# Patient Record
Sex: Female | Born: 1985
Health system: Southern US, Community
[De-identification: ages and names within clinical notes are randomized; demographics above are authoritative.]

## PROBLEM LIST (undated history)

## (undated) DIAGNOSIS — L732 Hidradenitis suppurativa: Secondary | ICD-10-CM

## (undated) HISTORY — DX: Hidradenitis suppurativa: L73.2

## (undated) HISTORY — PX: NO PAST SURGERIES: SHX2092

---

## 2006-09-04 ENCOUNTER — Ambulatory Visit (HOSPITAL_COMMUNITY): Admission: RE | Admit: 2006-09-04 | Discharge: 2006-09-04 | Payer: Self-pay | Admitting: Obstetrics & Gynecology

## 2006-10-09 ENCOUNTER — Ambulatory Visit (HOSPITAL_COMMUNITY): Admission: RE | Admit: 2006-10-09 | Discharge: 2006-10-09 | Payer: Self-pay | Admitting: Obstetrics & Gynecology

## 2007-01-11 ENCOUNTER — Inpatient Hospital Stay (HOSPITAL_COMMUNITY): Admission: AD | Admit: 2007-01-11 | Discharge: 2007-01-11 | Payer: Self-pay | Admitting: Obstetrics & Gynecology

## 2007-01-12 ENCOUNTER — Inpatient Hospital Stay (HOSPITAL_COMMUNITY): Admission: AD | Admit: 2007-01-12 | Discharge: 2007-01-12 | Payer: Self-pay | Admitting: Obstetrics & Gynecology

## 2007-01-12 ENCOUNTER — Inpatient Hospital Stay (HOSPITAL_COMMUNITY): Admission: AD | Admit: 2007-01-12 | Discharge: 2007-01-15 | Payer: Self-pay | Admitting: Obstetrics

## 2008-05-23 ENCOUNTER — Ambulatory Visit (HOSPITAL_COMMUNITY): Admission: RE | Admit: 2008-05-23 | Discharge: 2008-05-23 | Payer: Self-pay | Admitting: Obstetrics & Gynecology

## 2008-10-01 ENCOUNTER — Inpatient Hospital Stay (HOSPITAL_COMMUNITY): Admission: AD | Admit: 2008-10-01 | Discharge: 2008-10-03 | Payer: Self-pay | Admitting: Obstetrics & Gynecology

## 2008-10-01 ENCOUNTER — Inpatient Hospital Stay (HOSPITAL_COMMUNITY): Admission: AD | Admit: 2008-10-01 | Discharge: 2008-10-01 | Payer: Self-pay | Admitting: Obstetrics

## 2010-10-03 ENCOUNTER — Encounter: Payer: Self-pay | Admitting: Obstetrics & Gynecology

## 2010-12-27 LAB — CBC
HCT: 31.1 % — ABNORMAL LOW (ref 36.0–46.0)
HCT: 34.9 % — ABNORMAL LOW (ref 36.0–46.0)
Hemoglobin: 10.5 g/dL — ABNORMAL LOW (ref 12.0–15.0)
Hemoglobin: 11.8 g/dL — ABNORMAL LOW (ref 12.0–15.0)
MCHC: 33.7 g/dL (ref 30.0–36.0)
MCV: 90.7 fL (ref 78.0–100.0)
RBC: 3.39 MIL/uL — ABNORMAL LOW (ref 3.87–5.11)
RDW: 15.4 % (ref 11.5–15.5)
RDW: 15.9 % — ABNORMAL HIGH (ref 11.5–15.5)
WBC: 17 10*3/uL — ABNORMAL HIGH (ref 4.0–10.5)

## 2011-01-25 NOTE — H&P (Signed)
Annette Benitez, Annette Benitez      ACCOUNT NO.:  [id number]   MEDICAL RECORD NO.:  [id number]          PATIENT TYPE:  INP   LOCATION:  9127                          FACILITY:  [facility]   PHYSICIAN:  Roseanna Rainbow, M.D.DATE OF BIRTH:  01-20-1986   DATE OF ADMISSION:  10/01/2008  DATE OF DISCHARGE:                              HISTORY & PHYSICAL   CHIEF COMPLAINT:  The patient is a 25 year old para 1 with an estimated  date of confinement of January 18 with an intrauterine pregnancy at 40  plus weeks complaining of contractions.   HISTORY OF PRESENT ILLNESS:  Please see the above.  She denies rupture  of membranes.   ALLERGIES:  No known drug allergies.   MEDICATIONS:  Please see the medication reconciliation form.   OB RISK FACTORS:  GBS asymptomatic bacteriuria.   PRENATAL LABS:  Blood type is O positive.  Antibody screen negative.  Chlamydia probe negative.  Urine culture and sensitivity on May 08, 2008, greater than 100,000 colonies/mL.  GBS isolated.  Pap smear  negative.  A 1-hour GCT 86.  Hepatitis B surface antigen negative,  hematocrit 33, hemoglobin 10.8, HIV nonreactive, platelets 223,000.  RPR  nonreactive, rubella immune.  Sickle cell negative.  Varicella immune.   PAST OBSTETRICAL HISTORY:  There is a history of two voluntary  terminations of pregnancy.  In May 2008, she was delivered of a live  born female 8 pounds 5 ounces, full term vaginal delivery, no  complications.   PAST GYN HISTORY:  Noncontributory.   PAST MEDICAL HISTORY:  Recurrent urinary tract infections.   PAST SURGICAL HISTORY:  No previous surgery.   SOCIAL HISTORY:  She is a Diplomatic Services operational officer, single, living with the significant  other.  Does not give any significant history of alcohol usage, has no  significant smoking history.  Denies illicit drug use.   FAMILY HISTORY:  Remarkable for diabetes and hypertension.   PHYSICAL EXAMINATION:  VITAL SIGNS:  Stable, afebrile.  Fetal heart  tracing  reassuring.  Tocodynamometer uterine contractions every 2-4  minutes.  Sterile vaginal exam per the RN.  The cervix is 4 cm dilated.   ASSESSMENT:  Primipara at term, late latent versus early active labor.  Fetal heart tracing consistent with fetal well being.  GBS positive.   PLAN:  Admission, penicillin for GBS prophylaxis per protocol.  Monitor  progress.  Anticipate a spontaneous vaginal delivery.      Roseanna Rainbow, M.D.  Electronically Signed     LAJ/MEDQ  D:  10/01/2008  T:  10/02/2008  Job:  [id number]

## 2012-07-31 ENCOUNTER — Emergency Department (INDEPENDENT_AMBULATORY_CARE_PROVIDER_SITE_OTHER)
Admission: EM | Admit: 2012-07-31 | Discharge: 2012-07-31 | Disposition: A | Discharge status: Home / Self Care | Payer: Self-pay | Source: Home / Self Care | Attending: Emergency Medicine | Admitting: Emergency Medicine

## 2012-07-31 ENCOUNTER — Encounter (HOSPITAL_COMMUNITY): Payer: Self-pay | Admitting: *Deleted

## 2012-07-31 DIAGNOSIS — M549 Dorsalgia, unspecified: Secondary | ICD-10-CM

## 2012-07-31 DIAGNOSIS — M542 Cervicalgia: Secondary | ICD-10-CM

## 2012-07-31 MED ORDER — KETOROLAC TROMETHAMINE 60 MG/2ML IM SOLN
INTRAMUSCULAR | Status: AC
  Filled 2012-07-31: qty 2

## 2012-07-31 MED ORDER — CYCLOBENZAPRINE HCL 10 MG PO TABS: 10.0000 mg | ORAL_TABLET | Freq: Three times a day (TID) | ORAL | Status: DC | PRN

## 2012-07-31 MED ORDER — NAPROXEN 500 MG PO TABS: 500.0000 mg | ORAL_TABLET | Freq: Two times a day (BID) | ORAL | Status: DC

## 2012-07-31 MED ORDER — TRAMADOL HCL 50 MG PO TABS: 50.0000 mg | ORAL_TABLET | Freq: Four times a day (QID) | ORAL | Status: DC | PRN

## 2012-07-31 MED ORDER — KETOROLAC TROMETHAMINE 60 MG/2ML IM SOLN
60.0000 mg | Freq: Once | INTRAMUSCULAR | Status: AC
  Administered 2012-07-31: 60 mg via INTRAMUSCULAR

## 2012-07-31 NOTE — ED Provider Notes (Signed)
Medical screening examination/treatment/procedure(s) were performed by non-physician practitioner and as supervising physician I was immediately available for consultation/collaboration.  Leslee Home, M.D.   Reuben Likes, MD 07/31/12 [phone]
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
## 2013-10-01 ENCOUNTER — Encounter: Payer: Self-pay | Admitting: Obstetrics & Gynecology

## 2013-11-18 ENCOUNTER — Ambulatory Visit: Payer: Self-pay | Admitting: Obstetrics & Gynecology

## 2014-08-17 ENCOUNTER — Encounter (HOSPITAL_COMMUNITY): Payer: Self-pay | Admitting: *Deleted

## 2014-08-17 ENCOUNTER — Emergency Department (HOSPITAL_COMMUNITY)
Admission: EM | Admit: 2014-08-17 | Discharge: 2014-08-17 | Disposition: A | Discharge status: Home / Self Care | Payer: Federal, State, Local not specified - PPO | Attending: Emergency Medicine | Admitting: Emergency Medicine

## 2014-08-17 DIAGNOSIS — Z3202 Encounter for pregnancy test, result negative: Secondary | ICD-10-CM | POA: Diagnosis not present

## 2014-08-17 DIAGNOSIS — Z791 Long term (current) use of non-steroidal anti-inflammatories (NSAID): Secondary | ICD-10-CM | POA: Diagnosis not present

## 2014-08-17 DIAGNOSIS — Z87891 Personal history of nicotine dependence: Secondary | ICD-10-CM | POA: Diagnosis not present

## 2014-08-17 DIAGNOSIS — M545 Low back pain: Secondary | ICD-10-CM | POA: Diagnosis present

## 2014-08-17 LAB — URINALYSIS, ROUTINE W REFLEX MICROSCOPIC
Bilirubin Urine: NEGATIVE
GLUCOSE, UA: NEGATIVE mg/dL
Hgb urine dipstick: NEGATIVE
KETONES UR: 15 mg/dL — AB
Nitrite: NEGATIVE
PH: 5.5 (ref 5.0–8.0)
Protein, ur: NEGATIVE mg/dL
Specific Gravity, Urine: 1.027 (ref 1.005–1.030)
Urobilinogen, UA: 0.2 mg/dL (ref 0.0–1.0)

## 2014-08-17 LAB — POC URINE PREG, ED: PREG TEST UR: NEGATIVE

## 2014-08-17 LAB — URINE MICROSCOPIC-ADD ON

## 2014-08-17 MED ORDER — IBUPROFEN 800 MG PO TABS: 800.0000 mg | ORAL_TABLET | Freq: Three times a day (TID) | ORAL | Status: DC | PRN

## 2014-08-17 MED ORDER — OXYCODONE-ACETAMINOPHEN 5-325 MG PO TABS
2.0000 | ORAL_TABLET | Freq: Once | ORAL | Status: AC
  Administered 2014-08-17: 2 via ORAL
  Filled 2014-08-17: qty 2

## 2014-08-17 MED ORDER — ONDANSETRON 4 MG PO TBDP
4.0000 mg | ORAL_TABLET | Freq: Once | ORAL | Status: AC
  Administered 2014-08-17: 4 mg via ORAL
  Filled 2014-08-17: qty 1

## 2014-08-17 MED ORDER — IBUPROFEN 800 MG PO TABS
800.0000 mg | ORAL_TABLET | Freq: Once | ORAL | Status: AC
  Administered 2014-08-17: 800 mg via ORAL
  Filled 2014-08-17: qty 1

## 2014-08-17 MED ORDER — DIAZEPAM 5 MG PO TABS: 5.0000 mg | ORAL_TABLET | Freq: Three times a day (TID) | ORAL | Status: DC | PRN

## 2014-08-17 MED ORDER — ONDANSETRON 4 MG PO TBDP: 4.0000 mg | ORAL_TABLET | Freq: Three times a day (TID) | ORAL | Status: DC | PRN

## 2014-08-17 MED ORDER — OXYCODONE-ACETAMINOPHEN 5-325 MG PO TABS: 1.0000 | ORAL_TABLET | Freq: Four times a day (QID) | ORAL | Status: DC | PRN

## 2014-08-17 NOTE — ED Provider Notes (Signed)
TIME SEEN: 12:15 PM  CHIEF COMPLAINT: Back pain  HPI: Pt is a 28 y.o. F with no significant past medical history who presents to the emergency department with complaints of lower back pain that started last night. He states pain started with pain in her buttocks and was worse with bending over. She states it now radiates into her lower abdomen is still worse with movement. Yesterday she did have some pressure with urinating and urinary frequency but to a Pyridium tablet and drink cranberry juice and states this resolved. No hematuria. No vaginal bleeding or discharge. Last menstrual period was November 17.  She denies fevers, chills, nausea, vomiting or diarrhea. She is sexually active with her husband only. She has an IUD.  ROS: See HPI Constitutional: no fever  Eyes: no drainage  ENT: no runny nose   Cardiovascular:  no chest pain  Resp: no SOB  GI: no vomiting GU:  dysuria Integumentary: no rash  Allergy: no hives  Musculoskeletal: no leg swelling  Neurological: no slurred speech ROS otherwise negative  PAST MEDICAL HISTORY/PAST SURGICAL HISTORY:  History reviewed. No pertinent past medical history.  MEDICATIONS:  Prior to Admission medications   Medication Sig Start Date End Date Taking? Authorizing Provider  ibuprofen (ADVIL,MOTRIN) 200 MG tablet Take 400 mg by mouth every 8 (eight) hours as needed for headache.   Yes Historical Provider, MD  traMADol (ULTRAM) 50 MG tablet Take 1 tablet (50 mg total) by mouth every 6 (six) hours as needed for pain. 07/31/12  Yes Johnsie Kindredarmen L Chatten, NP  cyclobenzaprine (FLEXERIL) 10 MG tablet Take 1 tablet (10 mg total) by mouth 3 (three) times daily as needed for muscle spasms. 07/31/12   Johnsie Kindredarmen L Chatten, NP  naproxen (NAPROSYN) 500 MG tablet Take 1 tablet (500 mg total) by mouth 2 (two) times daily with a meal. Patient not taking: Reported on 08/17/2014 07/31/12   Johnsie Kindredarmen L Chatten, NP    ALLERGIES:  No Known Allergies  SOCIAL HISTORY:   History  Substance Use Topics  . Smoking status: Former Games developermoker  . Smokeless tobacco: Not on file  . Alcohol Use: Yes     Comment: occasionally    FAMILY HISTORY: History reviewed. No pertinent family history.  EXAM: BP 115/83 mmHg  Pulse 95  Temp(Src) 98.1 F (36.7 C) (Oral)  Resp 17  Ht 5\' 7"  (1.702 m)  Wt 160 lb (72.576 kg)  BMI 25.05 kg/m2  SpO2 100%  LMP 07/29/2014 CONSTITUTIONAL: Alert and oriented and responds appropriately to questions. Well-appearing; well-nourished HEAD: Normocephalic EYES: Conjunctivae clear, PERRL ENT: normal nose; no rhinorrhea; moist mucous membranes; pharynx without lesions noted NECK: Supple, no meningismus, no LAD  CARD: RRR; S1 and S2 appreciated; no murmurs, no clicks, no rubs, no gallops RESP: Normal chest excursion without splinting or tachypnea; breath sounds clear and equal bilaterally; no wheezes, no rhonchi, no rales,  ABD/GI: Normal bowel sounds; non-distended; soft, non-tender, no rebound, no guarding; no peritoneal signs, no tenderness at McBurney's point BACK:  The back appears normal and is tender to palpation over the lumbar paraspinal musculature without midline spinal tenderness or step-off or deformity, there is no CVA tenderness EXT: Normal ROM in all joints; non-tender to palpation; no edema; normal capillary refill; no cyanosis    SKIN: Normal color for age and race; warm NEURO: Moves all extremities equally; sensation to light touch intact diffusely, normal gait, no clonus, 2+ bilateral patellar and Achilles deep tendon reflexes PSYCH: The patient's mood and manner are appropriate.  Grooming and personal hygiene are appropriate.  MEDICAL DECISION MAKING: Patient here with what appears to be musculoskeletal back pain. She is neurologically intact and has no fever. No red flag symptoms to suggest epidural abscess, epidural hematoma, spinal stenosis, cauda equina, discitis or transverse myelitis. I do not feel she needs back  imaging at this time. She is not having vaginal bleeding or discharge and has no abdominal pain on exam. Given she did have dysuria and urinary frequency yesterday will obtain urinalysis. No flank tenderness on exam. Urine pregnancy test is negative. Will give ibuprofen, Percocet and reassess.  ED PROGRESS: Patient's urine shows leukocytes but also many squamous cells and few bacteria. Suspect dirty catch. No other sign of infection. I suspect this is more likely musculoskeletal in nature and I feel she is safe to be discharged home. We'll discharge with ibuprofen, Percocet, Valium and Zofran. Have advised her to follow-up with her PCP if pain continues despite medical management after one week. Discussed return precautions including abdominal pain, fevers, vomiting, neurologic deficits. She verbalized understanding and is comfortable with this plan.     Layla MawKristen N Sherron Mapp, DO 08/17/14 1346

## 2014-08-17 NOTE — ED Notes (Signed)
Pt was given water. 

## 2014-08-17 NOTE — ED Notes (Signed)
Pt reports lower abd pain and lower back pain, yesterday had urinary frequency and pressure, but pt took azo and felt better. Today has lower abd pain when she bends over.

## 2014-08-17 NOTE — Discharge Instructions (Signed)
Back Pain, Adult °Low back pain is very common. About 1 in 5 people have back pain. The cause of low back pain is rarely dangerous. The pain often gets better over time. About half of people with a sudden onset of back pain feel better in just 2 weeks. About 8 in 10 people feel better by 6 weeks.  °CAUSES °Some common causes of back pain include: °· Strain of the muscles or ligaments supporting the spine. °· Wear and tear (degeneration) of the spinal discs. °· Arthritis. °· Direct injury to the back. °DIAGNOSIS °Most of the time, the direct cause of low back pain is not known. However, back pain can be treated effectively even when the exact cause of the pain is unknown. Answering your caregiver's questions about your overall health and symptoms is one of the most accurate ways to make sure the cause of your pain is not dangerous. If your caregiver needs more information, he or she may order lab work or imaging tests (X-rays or MRIs). However, even if imaging tests show changes in your back, this usually does not require surgery. °HOME CARE INSTRUCTIONS °For many people, back pain returns. Since low back pain is rarely dangerous, it is often a condition that people can learn to manage on their own.  °· Remain active. It is stressful on the back to sit or stand in one place. Do not sit, drive, or stand in one place for more than 30 minutes at a time. Take short walks on level surfaces as soon as pain allows. Try to increase the length of time you walk each day. °· Do not stay in bed. Resting more than 1 or 2 days can delay your recovery. °· Do not avoid exercise or work. Your body is made to move. It is not dangerous to be active, even though your back may hurt. Your back will likely heal faster if you return to being active before your pain is gone. °· Pay attention to your body when you  bend and lift. Many people have less discomfort when lifting if they bend their knees, keep the load close to their bodies, and  avoid twisting. Often, the most comfortable positions are those that put less stress on your recovering back. °· Find a comfortable position to sleep. Use a firm mattress and lie on your side with your knees slightly bent. If you lie on your back, put a pillow under your knees. °· Only take over-the-counter or prescription medicines as directed by your caregiver. Over-the-counter medicines to reduce pain and inflammation are often the most helpful. Your caregiver may prescribe muscle relaxant drugs. These medicines help dull your pain so you can more quickly return to your normal activities and healthy exercise. °· Put ice on the injured area. °¨ Put ice in a plastic bag. °¨ Place a towel between your skin and the bag. °¨ Leave the ice on for 15-20 minutes, 03-04 times a day for the first 2 to 3 days. After that, ice and heat may be alternated to reduce pain and spasms. °· Ask your caregiver about trying back exercises and gentle massage. This may be of some benefit. °· Avoid feeling anxious or stressed. Stress increases muscle tension and can worsen back pain. It is important to recognize when you are anxious or stressed and learn ways to manage it. Exercise is a great option. °SEEK MEDICAL CARE IF: °· You have pain that is not relieved with rest or medicine. °· You have pain that does not improve in 1 week. °· You have new symptoms. °· You are generally not feeling well. °SEEK   IMMEDIATE MEDICAL CARE IF:  °· You have pain that radiates from your back into your legs. °· You develop new bowel or bladder control problems. °· You have unusual weakness or numbness in your arms or legs. °· You develop nausea or vomiting. °· You develop abdominal pain. °· You feel faint. °Document Released: 08/29/2005 Document Revised: 02/28/2012 Document Reviewed: 12/31/2013 °ExitCare® Patient Information ©2015 [employer]. This information is not intended to replace advice given to you by your health care provider. Make sure you  discuss any questions you have with your health care provider. ° °Lumbosacral Strain °Lumbosacral strain is a strain of any of the parts that make up your lumbosacral vertebrae. Your lumbosacral vertebrae are the bones that make up the lower third of your backbone. Your lumbosacral vertebrae are held together by muscles and tough, fibrous tissue (ligaments).  °CAUSES  °A sudden blow to your back can cause lumbosacral strain. Also, anything that causes an excessive stretch of the muscles in the low back can cause this strain. This is typically seen when people exert themselves strenuously, fall, lift heavy objects, bend, or crouch repeatedly. °RISK FACTORS °· Physically demanding work. °· Participation in pushing or pulling sports or sports that require a sudden twist of the back (tennis, golf, baseball). °· Weight lifting. °· Excessive lower back curvature. °· Forward-tilted pelvis. °· Weak back or abdominal muscles or both. °· Tight hamstrings. °SIGNS AND SYMPTOMS  °Lumbosacral strain may cause pain in the area of your injury or pain that moves (radiates) down your leg.  °DIAGNOSIS °Your health care provider can often diagnose lumbosacral strain through a physical exam. In some cases, you may need tests such as X-ray exams.  °TREATMENT  °Treatment for your lower back injury depends on many factors that your clinician will have to evaluate. However, most treatment will include the use of anti-inflammatory medicines. °HOME CARE INSTRUCTIONS  °· Avoid hard physical activities (tennis, racquetball, waterskiing) if you are not in proper physical condition for it. This may aggravate or create problems. °· If you have a back problem, avoid sports requiring sudden body movements. Swimming and walking are generally safer activities. °· Maintain good posture. °· Maintain a healthy weight. °· For acute conditions, you may put ice on the injured area. °¨ Put ice in a plastic bag. °¨ Place a towel between your skin and the  bag. °¨ Leave the ice on for 20 minutes, 2-3 times a day. °· When the low back starts healing, stretching and strengthening exercises may be recommended. °SEEK MEDICAL CARE IF: °· Your back pain is getting worse. °· You experience severe back pain not relieved with medicines. °SEEK IMMEDIATE MEDICAL CARE IF:  °· You have numbness, tingling, weakness, or problems with the use of your arms or legs. °· There is a change in bowel or bladder control. °· You have increasing pain in any area of the body, including your belly (abdomen). °· You notice shortness of breath, dizziness, or feel faint. °· You feel sick to your stomach (nauseous), are throwing up (vomiting), or become sweaty. °· You notice discoloration of your toes or legs, or your feet get very cold. °MAKE SURE YOU:  °· Understand these instructions. °· Will watch your condition. °· Will get help right away if you are not doing well or get worse. °Document Released: 06/08/2005 Document Revised: 09/03/2013 Document Reviewed: 04/17/2013 °ExitCare® Patient Information ©2015 [employer]. This information is not intended to replace advice given to you by your health care provider.   Make sure you discuss any questions you have with your health care provider. ° °

## 2014-09-08 ENCOUNTER — Encounter: Payer: Self-pay | Admitting: *Deleted

## 2014-09-09 ENCOUNTER — Encounter: Payer: Self-pay | Admitting: Obstetrics & Gynecology

## 2016-09-12 NOTE — L&D Delivery Note (Signed)
Patient is 31 y.o. [id number] [redacted]w[redacted]d admitted for SOL. S/p augmentation with Pitocin. AROM at 1520.  Prenatal course also complicated by hydradenitis suppurativa and anemia.  Delivery Note At 6:51 PM a viable female was delivered via Vaginal, Spontaneous Delivery (Presentation: cephalic; ROA).  APGAR: 8, 9; weight pending.   Placenta status: grossly normal, intact.  Cord: 3 vessel without complications.  Cord pH: not drawn  Anesthesia: Epidural Episiotomy: None Lacerations: None Suture Repair: none Est. Blood Loss (mL): 100   Upon arrival patient was complete and pushing. She pushed with good maternal effort to deliver a viable female infant in cephalic, ROA position. No nuchal cord present.  Baby delivered without difficulty (anterior shoulder delivered with gentle traction on bilateral axillae), was noted to have good tone and placed on maternal abdomen for oral suctioning, drying and stimulation. Delayed cord clamping performed. Placenta delivered spontaneously with gentle cord traction. Fundus firm with massage and Pitocin. Perineum inspected and found to have no lacerations.  Counts of sharps, instruments, and lap pads were all correct.  Mom to postpartum.  Baby to Couplet care / Skin to Skin.  Amanda C. Frances Furbish, MD PGY-1, [facility] 06/06/2017 7:07 PM  Patient is a [id number] at [redacted]w[redacted]d who was admitted in latent labor and augmented with Pit/AROM, essentially uncomplicated prenatal course.  She progressed with augmentation via Pit/AROM.  I was gloved and present for delivery in its entirety.  Second stage of labor progressed, baby delivered after a few contractions.  No decels during second stage noted.  Complications: none  Lacerations: none  EBL: 100cc  Garyson Stelly, CNM 8:14 PM 06/06/2017

## 2016-09-30 ENCOUNTER — Ambulatory Visit (INDEPENDENT_AMBULATORY_CARE_PROVIDER_SITE_OTHER): Payer: Federal, State, Local not specified - PPO

## 2016-09-30 DIAGNOSIS — Z3201 Encounter for pregnancy test, result positive: Secondary | ICD-10-CM | POA: Diagnosis not present

## 2016-09-30 DIAGNOSIS — Z3200 Encounter for pregnancy test, result unknown: Secondary | ICD-10-CM

## 2016-09-30 LAB — POCT URINE PREGNANCY: PREG TEST UR: POSITIVE — AB

## 2016-09-30 NOTE — Progress Notes (Signed)
Patient is in the office for pregnancy test, results are positive. LMP 08-22-16, EDD 05-29-17, patient is 3546w4d.

## 2016-11-11 ENCOUNTER — Encounter: Payer: Self-pay | Admitting: Certified Nurse Midwife

## 2016-11-11 ENCOUNTER — Ambulatory Visit (INDEPENDENT_AMBULATORY_CARE_PROVIDER_SITE_OTHER): Payer: Federal, State, Local not specified - PPO | Admitting: Certified Nurse Midwife

## 2016-11-11 VITALS — BP 131/73 | HR 114 | Wt 188.6 lb

## 2016-11-11 DIAGNOSIS — Z113 Encounter for screening for infections with a predominantly sexual mode of transmission: Secondary | ICD-10-CM

## 2016-11-11 DIAGNOSIS — Z124 Encounter for screening for malignant neoplasm of cervix: Secondary | ICD-10-CM

## 2016-11-11 DIAGNOSIS — Z1151 Encounter for screening for human papillomavirus (HPV): Secondary | ICD-10-CM | POA: Diagnosis not present

## 2016-11-11 DIAGNOSIS — Z3481 Encounter for supervision of other normal pregnancy, first trimester: Secondary | ICD-10-CM

## 2016-11-11 DIAGNOSIS — Z3687 Encounter for antenatal screening for uncertain dates: Secondary | ICD-10-CM

## 2016-11-11 DIAGNOSIS — L732 Hidradenitis suppurativa: Secondary | ICD-10-CM | POA: Insufficient documentation

## 2016-11-11 DIAGNOSIS — Z3480 Encounter for supervision of other normal pregnancy, unspecified trimester: Secondary | ICD-10-CM

## 2016-11-11 MED ORDER — AMOXICILLIN-POT CLAVULANATE 875-125 MG PO TABS: 1.0000 | ORAL_TABLET | Freq: Two times a day (BID) | ORAL | 0 refills | Status: DC

## 2016-11-11 NOTE — Progress Notes (Signed)
Pain in heels and body aching with pregnancy.

## 2016-11-11 NOTE — Addendum Note (Signed)
Addended by: Elby BeckPAUL, JANE F on: 11/11/2016 10:32 AM   Modules accepted: Orders

## 2016-11-11 NOTE — Progress Notes (Signed)
Subjective:    Annette Benitez is being seen today for her first obstetrical visit.  This is a planned pregnancy. She is at [redacted]w[redacted]d gestation. Her obstetrical history is significant for none. Relationship with FOB: spouse, living together. Patient does intend to breast feed. Pregnancy history fully reviewed.  The information documented in the HPI was reviewed and verified.  Menstrual History: OB History    Gravida Para Term Preterm AB Living   5 2 2   2 2    SAB TAB Ectopic Multiple Live Births     2     2       Patient's last menstrual period was 08/22/2016 (exact date).    Past Medical History:  Diagnosis Date  . Hidradenitis     History reviewed. No pertinent surgical history.   (Not in a hospital admission) No Known Allergies  Social History  Substance Use Topics  . Smoking status: Former Games developer  . Smokeless tobacco: Never Used  . Alcohol use No     Comment: occasionally    Family History  Problem Relation Age of Onset  . Hypertension Father      Review of Systems Constitutional: negative for weight loss Gastrointestinal: negative for vomiting Genitourinary:negative for genital lesions and vaginal discharge and dysuria Musculoskeletal:negative for back pain Behavioral/Psych: negative for abusive relationship, depression, illegal drug usage and tobacco use    Objective:    BP 131/73   Pulse (!) 114   Wt 188 lb 9.6 oz (85.5 kg)   LMP 08/22/2016 (Exact Date)   BMI 29.54 kg/m  General Appearance:    Alert, cooperative, no distress, appears stated age  Head:    Normocephalic, without obvious abnormality, atraumatic  Eyes:    PERRL, conjunctiva/corneas clear, EOM's intact, fundi    benign, both eyes  Ears:    Normal TM's and external ear canals, both ears  Nose:   Nares normal, septum midline, mucosa normal, no drainage    or sinus tenderness  Throat:   Lips, mucosa, and tongue normal; teeth and gums normal  Neck:   Supple, symmetrical, trachea midline, no  adenopathy;    thyroid:  no enlargement/tenderness/nodules; no carotid   bruit or JVD  Back:     Symmetric, no curvature, ROM normal, no CVA tenderness  Lungs:     Clear to auscultation bilaterally, respirations unlabored  Chest Wall:    No tenderness or deformity   Heart:    Regular rate and rhythm, S1 and S2 normal, no murmur, rub   or gallop  Breast Exam:    No tenderness, masses, or nipple abnormality  Abdomen:     Soft, non-tender, bowel sounds active all four quadrants,    no masses, no organomegaly  Genitalia:    Normal female, + multiple boils present on labia and groin. Declines lancing today.   Extremities:   Extremities normal, atraumatic, no cyanosis or edema  Pulses:   2+ and symmetric all extremities  Skin:   Skin color, texture, turgor normal, no rashes or lesions  Lymph nodes:   Cervical, supraclavicular, and axillary nodes normal  Neurologic:   CNII-XII intact, normal strength, sensation and reflexes    throughout          Cervix:  Long, closed, thick, posterior.  FHR: 172   By doppler.   Size around 12    wk size.     Lab Review Urine pregnancy test Labs reviewed no Radiologic studies reviewed no Assessment:    Pregnancy at [redacted]w[redacted]d  weeks   Supervision of other normal pregnancy, antepartum - Plan: TSH, Hemoglobinopathy evaluation, Varicella zoster antibody, IgG, Culture, OB Urine, MaterniT21 PLUS Core+SCA, Hemoglobin A1c, Obstetric Panel, Including HIV, Cystic Fibrosis Mutation 97  Unsure of LMP (last menstrual period) as reason for ultrasound scan - Plan: US OB Comp Less 14 Wks, US OB Transvaginal  Hidradenitis - Plan: amoxicillin-clavulanate (AUGMENTIN) 875-125 MG tablet   Plan:      Prenatal vitamins. Samples OB complete given.  Counseling provided regarding continued use of seat belts, cessation of alcohol consumption, smoking or use of illicit drugs; infection precautions i.e., influenza/TDAP immunizations, toxoplasmosis,CMV, parvovirus, listeria and  varicella; workplace safety, exercise during pregnancy; routine dental care, safe medications, sexual activity, hot tubs, saunas, pools, travel, caffeine use, fish and methlymercury, potential toxins, hair treatments, varicose veins Weight gain recommendations per IOM guidelines reviewed: underweight/BMI< 18.5--> gain 28 - 40 lbs; normal weight/BMI 18.5 - 24.9--> gain 25 - 35 lbs; overweight/BMI 25 - 29.9--> gain 15 - 25 lbs; obese/BMI >30->gain  11 - 20 lbs Problem list reviewed and updated. FIRST/CF mutation testing/NIPT/QUAD SCREEN/fragile X/Ashkenazi Jewish population testing/Spinal muscular atrophy discussed: ordered. Role of ultrasound in pregnancy discussed; fetal survey: requested. Amniocentesis discussed: not indicated.  Meds ordered this encounter  Medications  . amoxicillin-clavulanate (AUGMENTIN) 875-125 MG tablet    Sig: Take 1 tablet by mouth 2 (two) times daily.    Dispense:  28 tablet    Refill:  0   Orders Placed This Encounter  Procedures  . Culture, OB Urine  . US OB Comp Less 14 Wks    Standing Status:   Future    Standing Expiration Date:   01/11/2018    Order Specific Question:   Reason for Exam (SYMPTOM  OR DIAGNOSIS REQUIRED)    Answer:   dating    Order Specific Question:   Preferred imaging location?    Answer:   [facility]  . US OB Transvaginal    Standing Status:   Future    Standing Expiration Date:   01/11/2018    Order Specific Question:   Reason for Exam (SYMPTOM  OR DIAGNOSIS REQUIRED)    Answer:   dating    Order Specific Question:   Preferred imaging location?    Answer:   [facility]  . TSH  . Hemoglobinopathy evaluation  . Varicella zoster antibody, IgG  . MaterniT21 PLUS Core+SCA    Order Specific Question:   Is the patient insulin dependent?    Answer:   No    Order Specific Question:   Please enter gestational age. This should be expressed as weeks AND days, i.e. 16w 6d. Enter weeks here. Enter days in next question.    Answer:   9     Order Specific Question:   Please enter gestational age. This should be expressed as weeks AND days, i.e. 16w 6d. Enter days here. Enter weeks in previous question.    Answer:   4    Order Specific Question:   How was gestational age calculated?    Answer:   LMP    Order Specific Question:   Please give the date of LMP OR Ultrasound OR Estimated date of delivery.    Answer:   05/29/2017    Order Specific Question:   Number of Fetuses (Type of Pregnancy):    Answer:   1    Order Specific Question:   Indications for performing the test? (please choose all that apply):    Answer:  Routine screening    Order Specific Question:   Other Indications? (Y=Yes, N=No)    Answer:   N    Order Specific Question:   If this is a repeat specimen, please indicate the reason:    Answer:   Not indicated    Order Specific Question:   Please specify the patient's race: (C=White/Caucasion, B=Black, I=Native American, A=Asian, H=Hispanic, O=Other, U=Unknown)    Answer:   B    Order Specific Question:   Donor Egg - indicate if the egg was obtained from in vitro fertilization.    Answer:   N    Order Specific Question:   Age of Egg Donor.    Answer:   [age]    Order Specific Question:   Prior Down Syndrome/ONTD screening during current pregnancy.    Answer:   N    Order Specific Question:   Prior First Trimester Testing    Answer:   N    Order Specific Question:   Prior Second Trimester Testing    Answer:   N    Order Specific Question:   Family History of Neural Tube Defects    Answer:   N    Order Specific Question:   Prior Pregnancy with Down Syndrome    Answer:   N    Order Specific Question:   Please give the patient's weight (in pounds)    Answer:   182  . Hemoglobin A1c  . Obstetric Panel, Including HIV  . Cystic Fibrosis Mutation 97    Follow up in 4 weeks. 50% of 30 min visit spent on counseling and coordination of care.

## 2016-11-13 LAB — CULTURE, OB URINE

## 2016-11-13 LAB — URINE CULTURE, OB REFLEX: Organism ID, Bacteria: NO GROWTH

## 2016-11-14 LAB — CYTOLOGY - PAP
Diagnosis: NEGATIVE
HPV (WINDOPATH): NOT DETECTED

## 2016-11-14 LAB — CERVICOVAGINAL ANCILLARY ONLY
BACTERIAL VAGINITIS: NEGATIVE
Candida vaginitis: NEGATIVE
Chlamydia: NEGATIVE
Neisseria Gonorrhea: NEGATIVE
TRICH (WINDOWPATH): NEGATIVE

## 2016-11-15 LAB — OBSTETRIC PANEL, INCLUDING HIV
Antibody Screen: NEGATIVE
BASOS ABS: 0 10*3/uL (ref 0.0–0.2)
Basos: 0 %
EOS (ABSOLUTE): 0.1 10*3/uL (ref 0.0–0.4)
Eos: 1 %
HEP B S AG: NEGATIVE
HIV Screen 4th Generation wRfx: NONREACTIVE
Hematocrit: 33.4 % — ABNORMAL LOW (ref 34.0–46.6)
Hemoglobin: 10.9 g/dL — ABNORMAL LOW (ref 11.1–15.9)
IMMATURE GRANULOCYTES: 0 %
Immature Grans (Abs): 0 10*3/uL (ref 0.0–0.1)
LYMPHS: 26 %
Lymphocytes Absolute: 3.3 10*3/uL — ABNORMAL HIGH (ref 0.7–3.1)
MCH: 29.5 pg (ref 26.6–33.0)
MCHC: 32.6 g/dL (ref 31.5–35.7)
MCV: 90 fL (ref 79–97)
MONOCYTES: 6 %
Monocytes Absolute: 0.8 10*3/uL (ref 0.1–0.9)
NEUTROS PCT: 67 %
Neutrophils Absolute: 8.6 10*3/uL — ABNORMAL HIGH (ref 1.4–7.0)
PLATELETS: 331 10*3/uL (ref 150–379)
RBC: 3.7 x10E6/uL — ABNORMAL LOW (ref 3.77–5.28)
RDW: 13.7 % (ref 12.3–15.4)
RPR: NONREACTIVE
RUBELLA: 5.17 {index} (ref >0.99)
Rh Factor: POSITIVE
WBC: 13 10*3/uL — ABNORMAL HIGH (ref 3.4–10.8)

## 2016-11-15 LAB — HEMOGLOBINOPATHY EVALUATION
HGB C: 0 %
HGB S: 0 %
HGB VARIANT: 0 %
Hemoglobin A2 Quantitation: 2.5 % (ref 1.8–3.2)
Hemoglobin F Quantitation: 0 % (ref 0.0–2.0)
Hgb A: 97.5 % (ref 96.4–98.8)

## 2016-11-15 LAB — HEMOGLOBIN A1C
ESTIMATED AVERAGE GLUCOSE: 111 mg/dL
HEMOGLOBIN A1C: 5.5 % (ref 4.8–5.6)

## 2016-11-15 LAB — TSH: TSH: 0.036 u[IU]/mL — AB (ref 0.450–4.500)

## 2016-11-15 LAB — VARICELLA ZOSTER ANTIBODY, IGG: VARICELLA: 1246 {index} (ref >165)

## 2016-11-16 ENCOUNTER — Other Ambulatory Visit: Payer: Self-pay | Admitting: Certified Nurse Midwife

## 2016-11-16 ENCOUNTER — Ambulatory Visit (HOSPITAL_COMMUNITY)
Admission: RE | Admit: 2016-11-16 | Discharge: 2016-11-16 | Disposition: A | Discharge status: Home / Self Care | Payer: Federal, State, Local not specified - PPO | Source: Ambulatory Visit | Attending: Certified Nurse Midwife | Admitting: Certified Nurse Midwife

## 2016-11-16 DIAGNOSIS — Z3A12 12 weeks gestation of pregnancy: Secondary | ICD-10-CM | POA: Diagnosis not present

## 2016-11-16 DIAGNOSIS — Z3689 Encounter for other specified antenatal screening: Secondary | ICD-10-CM | POA: Insufficient documentation

## 2016-11-16 DIAGNOSIS — Z3687 Encounter for antenatal screening for uncertain dates: Secondary | ICD-10-CM

## 2016-11-18 ENCOUNTER — Other Ambulatory Visit: Payer: Self-pay | Admitting: Certified Nurse Midwife

## 2016-11-18 DIAGNOSIS — Z3480 Encounter for supervision of other normal pregnancy, unspecified trimester: Secondary | ICD-10-CM

## 2016-11-18 LAB — CYSTIC FIBROSIS MUTATION 97: Interpretation: NOT DETECTED

## 2016-11-20 LAB — MATERNIT21 PLUS CORE+SCA
CHROMOSOME 13: NEGATIVE
CHROMOSOME 21: NEGATIVE
Chromosome 18: NEGATIVE
Y Chromosome: DETECTED

## 2016-11-21 ENCOUNTER — Other Ambulatory Visit: Payer: Self-pay | Admitting: Certified Nurse Midwife

## 2016-11-21 DIAGNOSIS — Z3480 Encounter for supervision of other normal pregnancy, unspecified trimester: Secondary | ICD-10-CM

## 2016-11-21 LAB — SPECIMEN STATUS REPORT

## 2016-11-21 LAB — THYROID PANEL
Free Thyroxine Index: 2.2 (ref 1.2–4.9)
T3 Uptake Ratio: 21 % — ABNORMAL LOW (ref 24–39)
T4, Total: 10.6 ug/dL (ref 4.5–12.0)

## 2016-12-08 ENCOUNTER — Ambulatory Visit (INDEPENDENT_AMBULATORY_CARE_PROVIDER_SITE_OTHER): Payer: Federal, State, Local not specified - PPO | Admitting: Certified Nurse Midwife

## 2016-12-08 ENCOUNTER — Encounter: Payer: Self-pay | Admitting: Certified Nurse Midwife

## 2016-12-08 VITALS — BP 114/72 | HR 98 | Temp 98.2°F | Wt 189.4 lb

## 2016-12-08 DIAGNOSIS — O99012 Anemia complicating pregnancy, second trimester: Secondary | ICD-10-CM

## 2016-12-08 DIAGNOSIS — L732 Hidradenitis suppurativa: Secondary | ICD-10-CM

## 2016-12-08 DIAGNOSIS — Z3482 Encounter for supervision of other normal pregnancy, second trimester: Secondary | ICD-10-CM

## 2016-12-08 DIAGNOSIS — D649 Anemia, unspecified: Secondary | ICD-10-CM

## 2016-12-08 DIAGNOSIS — Z3480 Encounter for supervision of other normal pregnancy, unspecified trimester: Secondary | ICD-10-CM

## 2016-12-08 DIAGNOSIS — B3731 Acute candidiasis of vulva and vagina: Secondary | ICD-10-CM

## 2016-12-08 DIAGNOSIS — B373 Candidiasis of vulva and vagina: Secondary | ICD-10-CM

## 2016-12-08 MED ORDER — TERCONAZOLE 0.8 % VA CREA: 1.0000 | TOPICAL_CREAM | Freq: Every day | VAGINAL | 0 refills | Status: DC

## 2016-12-08 MED ORDER — FERRALET 90 90-1 MG PO TABS: 1.0000 | ORAL_TABLET | Freq: Every day | ORAL | 5 refills | Status: DC

## 2016-12-08 MED ORDER — FLUCONAZOLE 150 MG PO TABS: 150.0000 mg | ORAL_TABLET | Freq: Once | ORAL | 0 refills | Status: AC

## 2016-12-08 NOTE — Progress Notes (Signed)
   PRENATAL VISIT NOTE  Subjective:  Annette Benitez is a 31 y.o. [id number] at 8877w3d being seen today for ongoing prenatal care.  She is currently monitored for the following issues for this low-risk pregnancy and has Supervision of other normal pregnancy, antepartum and Hidradenitis on her problem list.  Patient reports vaginal irritation and being "cold"..  Contractions: Not present. Vag. Bleeding: None.   . Denies leaking of fluid.   The following portions of the patient's history were reviewed and updated as appropriate: allergies, current medications, past family history, past medical history, past social history, past surgical history and problem list. Problem list updated.  Objective:   Vitals:   12/08/16 0927  BP: 114/72  Pulse: 98  Temp: 98.2 F (36.8 C)  Weight: 189 lb 6.4 oz (85.9 kg)    Fetal Status:           General:  Alert, oriented and cooperative. Patient is in no acute distress.  Skin: Skin is warm and dry. No rash noted.   Cardiovascular: Normal heart rate noted  Respiratory: Normal respiratory effort, no problems with respiration noted  Abdomen: Soft, gravid, appropriate for gestational age. Pain/Pressure: Absent     Pelvic:  Cervical exam deferred        Extremities: Normal range of motion.  Edema: None  Mental Status: Normal mood and affect. Normal behavior. Normal judgment and thought content.   Assessment and Plan:  Pregnancy: [id number] at 8577w3d  1. Supervision of other normal pregnancy, antepartum - AFP, Serum, Open Spina Bifida - US MFM OB COMP + 14 WK; Future  2. Hidradenitis  Augmentin rx'd  previous visit    3. Anemia affecting pregnancy in second trimester Hemoglobin 10.9 last visit.  - Fe Cbn-Fe Gluc-FA-B12-C-DSS (FERRALET 90) 90-1 MG TABS; Take 1 tablet by mouth daily.  Dispense: 30 each; Refill: 5  4. Yeast vaginitis  - fluconazole (DIFLUCAN) 150 MG tablet; Take 1 tablet (150 mg total) by mouth once.  Dispense: 1 tablet; Refill: 0 -  terconazole (TERAZOL 3) 0.8 % vaginal cream; Place 1 applicator vaginally at bedtime.  Dispense: 20 g; Refill: 0  Preterm labor symptoms and general obstetric precautions including but not limited to vaginal bleeding, contractions, leaking of fluid and fetal movement were reviewed in detail with the patient. Please refer to After Visit Summary for other counseling recommendations.  No Follow-up on file.   Annette ParkinsonShanika K Eesha Schmaltz, Student-MidWife

## 2016-12-08 NOTE — Progress Notes (Signed)
Patient is in the office, denies pain/pressure. 

## 2016-12-16 ENCOUNTER — Other Ambulatory Visit: Payer: Self-pay | Admitting: Certified Nurse Midwife

## 2016-12-16 DIAGNOSIS — Z3480 Encounter for supervision of other normal pregnancy, unspecified trimester: Secondary | ICD-10-CM

## 2016-12-16 LAB — AFP, SERUM, OPEN SPINA BIFIDA
AFP MoM: 0.31
AFP Value: 13.7 ng/mL
GEST. AGE ON COLLECTION DATE: 18.6 wk
Maternal Age At EDD: 32.9 years
OSBR Risk 1 IN: 10000
TEST RESULTS AFP: NEGATIVE
WEIGHT: 189 [lb_av]

## 2016-12-29 ENCOUNTER — Other Ambulatory Visit: Payer: Self-pay | Admitting: Certified Nurse Midwife

## 2016-12-29 ENCOUNTER — Ambulatory Visit (HOSPITAL_COMMUNITY)
Admission: RE | Admit: 2016-12-29 | Discharge: 2016-12-29 | Disposition: A | Discharge status: Home / Self Care | Payer: Federal, State, Local not specified - PPO | Source: Ambulatory Visit | Attending: Certified Nurse Midwife | Admitting: Certified Nurse Midwife

## 2016-12-29 DIAGNOSIS — Z363 Encounter for antenatal screening for malformations: Secondary | ICD-10-CM | POA: Diagnosis present

## 2016-12-29 DIAGNOSIS — Z3A18 18 weeks gestation of pregnancy: Secondary | ICD-10-CM | POA: Diagnosis present

## 2016-12-29 DIAGNOSIS — Z3689 Encounter for other specified antenatal screening: Secondary | ICD-10-CM

## 2016-12-29 DIAGNOSIS — Z3492 Encounter for supervision of normal pregnancy, unspecified, second trimester: Secondary | ICD-10-CM | POA: Diagnosis present

## 2016-12-29 DIAGNOSIS — Z3480 Encounter for supervision of other normal pregnancy, unspecified trimester: Secondary | ICD-10-CM

## 2017-01-02 ENCOUNTER — Other Ambulatory Visit: Payer: Self-pay | Admitting: Certified Nurse Midwife

## 2017-01-02 DIAGNOSIS — Z3480 Encounter for supervision of other normal pregnancy, unspecified trimester: Secondary | ICD-10-CM

## 2017-01-05 ENCOUNTER — Ambulatory Visit (INDEPENDENT_AMBULATORY_CARE_PROVIDER_SITE_OTHER): Payer: Federal, State, Local not specified - PPO | Admitting: Certified Nurse Midwife

## 2017-01-05 ENCOUNTER — Encounter: Payer: Self-pay | Admitting: Certified Nurse Midwife

## 2017-01-05 DIAGNOSIS — Z3482 Encounter for supervision of other normal pregnancy, second trimester: Secondary | ICD-10-CM

## 2017-01-05 DIAGNOSIS — Z3480 Encounter for supervision of other normal pregnancy, unspecified trimester: Secondary | ICD-10-CM

## 2017-01-05 NOTE — Progress Notes (Signed)
   PRENATAL VISIT NOTE  Subjective:  Annette Benitez is a 31 y.o. [id number] at [redacted]w[redacted]d being seen today for ongoing prenatal care.  She is currently monitored for the following issues for this low-risk pregnancy and has Supervision of other normal pregnancy, antepartum and Hidradenitis on her problem list.  Patient reports no complaints.  Contractions: Not present. Vag. Bleeding: None.  Movement: Absent. Denies leaking of fluid.   The following portions of the patient's history were reviewed and updated as appropriate: allergies, current medications, past family history, past medical history, past social history, past surgical history and problem list. Problem list updated.  Objective:   Vitals:   01/05/17 1002  BP: 107/68  Pulse: 92  Weight: 192 lb (87.1 kg)    Fetal Status: Fetal Heart Rate (bpm): 149 Fundal Height: 20 cm Movement: Absent     General:  Alert, oriented and cooperative. Patient is in no acute distress.  Skin: Skin is warm and dry. No rash noted.   Cardiovascular: Normal heart rate noted  Respiratory: Normal respiratory effort, no problems with respiration noted  Abdomen: Soft, gravid, appropriate for gestational age. Pain/Pressure: Absent     Pelvic:  Cervical exam deferred        Extremities: Normal range of motion.  Edema: Trace  Mental Status: Normal mood and affect. Normal behavior. Normal judgment and thought content.   Assessment and Plan:  Pregnancy: A5W0981 at [redacted]w[redacted]d  1. Supervision of other normal pregnancy, antepartum      Doing well. HS stable with Humira.   Preterm labor symptoms and general obstetric precautions including but not limited to vaginal bleeding, contractions, leaking of fluid and fetal movement were reviewed in detail with the patient. Please refer to After Visit Summary for other counseling recommendations.  Return in about 4 weeks (around 02/02/2017) for ROB.   Roe Coombs, CNM

## 2017-02-02 ENCOUNTER — Ambulatory Visit (INDEPENDENT_AMBULATORY_CARE_PROVIDER_SITE_OTHER): Payer: Federal, State, Local not specified - PPO | Admitting: Certified Nurse Midwife

## 2017-02-02 VITALS — BP 114/74 | HR 94 | Wt 192.0 lb

## 2017-02-02 DIAGNOSIS — Z3480 Encounter for supervision of other normal pregnancy, unspecified trimester: Secondary | ICD-10-CM

## 2017-02-02 DIAGNOSIS — L732 Hidradenitis suppurativa: Secondary | ICD-10-CM

## 2017-02-02 DIAGNOSIS — Z3482 Encounter for supervision of other normal pregnancy, second trimester: Secondary | ICD-10-CM

## 2017-02-02 NOTE — Progress Notes (Signed)
   PRENATAL VISIT NOTE  Subjective:  Annette Benitez is a 31 y.o. [phone] at 9463w3d being seen today for ongoing prenatal care.  She is currently monitored for the following issues for this low-risk pregnancy and has Supervision of other normal pregnancy, antepartum and Hidradenitis on her problem list.  Patient reports no complaints.  Contractions: Not present. Vag. Bleeding: None.  Movement: Present. Denies leaking of fluid.   The following portions of the patient's history were reviewed and updated as appropriate: allergies, current medications, past family history, past medical history, past social history, past surgical history and problem list. Problem list updated.  Objective:   Vitals:   02/02/17 0947  BP: 114/74  Pulse: 94  Weight: 192 lb (87.1 kg)    Fetal Status: Fetal Heart Rate (bpm): 150 Fundal Height: 23 cm Movement: Present     General:  Alert, oriented and cooperative. Patient is in no acute distress.  Skin: Skin is warm and dry. No rash noted.   Cardiovascular: Normal heart rate noted  Respiratory: Normal respiratory effort, no problems with respiration noted  Abdomen: Soft, gravid, appropriate for gestational age. Pain/Pressure: Absent     Pelvic:  Cervical exam deferred        Extremities: Normal range of motion.     Mental Status: Normal mood and affect. Normal behavior. Normal judgment and thought content.   Assessment and Plan:  Pregnancy: [id number] at 2663w3d  1. Supervision of other normal pregnancy, antepartum   Reports doing well, reports not taking Humira as often for HS,stable.  Preterm labor symptoms and general obstetric precautions including but not limited to vaginal bleeding, contractions, leaking of fluid and fetal movement were reviewed in detail with the patient. Please refer to After Visit Summary for other counseling recommendations.  Return in about 4 weeks (around 03/02/2017) for ROB, 2 hr OGTT.   Roe Coombsachelle A Denney, CNM

## 2017-02-27 ENCOUNTER — Encounter: Payer: Self-pay | Admitting: Certified Nurse Midwife

## 2017-02-27 ENCOUNTER — Encounter: Payer: Self-pay | Admitting: *Deleted

## 2017-02-27 ENCOUNTER — Other Ambulatory Visit: Payer: Self-pay | Admitting: Certified Nurse Midwife

## 2017-02-27 DIAGNOSIS — L732 Hidradenitis suppurativa: Secondary | ICD-10-CM

## 2017-02-27 MED ORDER — CLINDAMYCIN PHOSPHATE 1 % EX GEL: Freq: Two times a day (BID) | CUTANEOUS | 2 refills | Status: DC

## 2017-02-27 NOTE — Telephone Encounter (Signed)
Pt has been made aware that provider has sent in a Rx. Pt advised to call office if she feels this Rx does not help her symptoms.

## 2017-03-02 ENCOUNTER — Ambulatory Visit (INDEPENDENT_AMBULATORY_CARE_PROVIDER_SITE_OTHER): Payer: Federal, State, Local not specified - PPO | Admitting: Certified Nurse Midwife

## 2017-03-02 ENCOUNTER — Other Ambulatory Visit: Payer: Federal, State, Local not specified - PPO

## 2017-03-02 ENCOUNTER — Encounter: Payer: Self-pay | Admitting: Certified Nurse Midwife

## 2017-03-02 VITALS — BP 123/77 | HR 98 | Wt 194.4 lb

## 2017-03-02 DIAGNOSIS — Z3482 Encounter for supervision of other normal pregnancy, second trimester: Secondary | ICD-10-CM

## 2017-03-02 DIAGNOSIS — Z3480 Encounter for supervision of other normal pregnancy, unspecified trimester: Secondary | ICD-10-CM

## 2017-03-02 NOTE — Progress Notes (Signed)
   PRENATAL VISIT NOTE  Subjective:  Annette Benitez is a 31 y.o. [phone] at 4768w3d being seen today for ongoing prenatal care.  She is currently monitored for the following issues for this low-risk pregnancy and has Supervision of other normal pregnancy, antepartum and Hidradenitis on her problem list.  Patient reports no complaints.  Contractions: Not present. Vag. Bleeding: None.  Movement: Present. Denies leaking of fluid.   The following portions of the patient's history were reviewed and updated as appropriate: allergies, current medications, past family history, past medical history, past social history, past surgical history and problem list. Problem list updated.  Objective:   Vitals:   03/02/17 0907  BP: 123/77  Pulse: 98  Weight: 194 lb 6.4 oz (88.2 kg)    Fetal Status: Fetal Heart Rate (bpm): 136 Fundal Height: 27 cm Movement: Present     General:  Alert, oriented and cooperative. Patient is in no acute distress.  Skin: Skin is warm and dry. No rash noted.   Cardiovascular: Normal heart rate noted  Respiratory: Normal respiratory effort, no problems with respiration noted  Abdomen: Soft, gravid, appropriate for gestational age. Pain/Pressure: Absent     Pelvic:  Cervical exam deferred        Extremities: Normal range of motion.  Edema: None  Mental Status: Normal mood and affect. Normal behavior. Normal judgment and thought content.   Assessment and Plan:  Pregnancy: A5W0981G5P2022 at 6368w3d  1. Supervision of other normal pregnancy, antepartum     Doing well - Glucose Tolerance, 2 Hours w/1 Hour - CBC - HIV antibody - RPR  Preterm labor symptoms and general obstetric precautions including but not limited to vaginal bleeding, contractions, leaking of fluid and fetal movement were reviewed in detail with the patient. Please refer to After Visit Summary for other counseling recommendations.  Return in about 2 weeks (around 03/16/2017) for ROB.   Roe Coombsachelle A Cullen Vanallen, CNM

## 2017-03-02 NOTE — Progress Notes (Signed)
Patient reports no concerns today 

## 2017-03-03 LAB — CBC
HEMATOCRIT: 30.2 % — AB (ref 34.0–46.6)
Hemoglobin: 9.8 g/dL — ABNORMAL LOW (ref 11.1–15.9)
MCH: 29.5 pg (ref 26.6–33.0)
MCHC: 32.5 g/dL (ref 31.5–35.7)
MCV: 91 fL (ref 79–97)
PLATELETS: 268 10*3/uL (ref 150–379)
RBC: 3.32 x10E6/uL — AB (ref 3.77–5.28)
RDW: 14.5 % (ref 12.3–15.4)
WBC: 10.7 10*3/uL (ref 3.4–10.8)

## 2017-03-03 LAB — HIV ANTIBODY (ROUTINE TESTING W REFLEX): HIV SCREEN 4TH GENERATION: NONREACTIVE

## 2017-03-03 LAB — RPR: RPR: NONREACTIVE

## 2017-03-03 LAB — GLUCOSE TOLERANCE, 2 HOURS W/ 1HR
GLUCOSE, FASTING: 81 mg/dL (ref 65–91)
Glucose, 1 hour: 131 mg/dL (ref 65–179)
Glucose, 2 hour: 112 mg/dL (ref 65–152)

## 2017-03-07 ENCOUNTER — Other Ambulatory Visit: Payer: Self-pay | Admitting: Certified Nurse Midwife

## 2017-03-07 DIAGNOSIS — O99013 Anemia complicating pregnancy, third trimester: Secondary | ICD-10-CM

## 2017-03-07 DIAGNOSIS — Z3480 Encounter for supervision of other normal pregnancy, unspecified trimester: Secondary | ICD-10-CM

## 2017-03-07 DIAGNOSIS — D509 Iron deficiency anemia, unspecified: Secondary | ICD-10-CM

## 2017-03-07 MED ORDER — CITRANATAL BLOOM 90-1 MG PO TABS: 1.0000 | ORAL_TABLET | Freq: Every day | ORAL | 12 refills | Status: DC

## 2017-03-23 ENCOUNTER — Encounter: Payer: Federal, State, Local not specified - PPO | Admitting: Certified Nurse Midwife

## 2017-03-24 ENCOUNTER — Ambulatory Visit (INDEPENDENT_AMBULATORY_CARE_PROVIDER_SITE_OTHER): Payer: Federal, State, Local not specified - PPO | Admitting: Obstetrics and Gynecology

## 2017-03-24 VITALS — BP 116/70 | HR 99 | Wt 194.0 lb

## 2017-03-24 DIAGNOSIS — Z3483 Encounter for supervision of other normal pregnancy, third trimester: Secondary | ICD-10-CM

## 2017-03-24 DIAGNOSIS — Z3480 Encounter for supervision of other normal pregnancy, unspecified trimester: Secondary | ICD-10-CM

## 2017-03-24 DIAGNOSIS — L732 Hidradenitis suppurativa: Secondary | ICD-10-CM

## 2017-03-24 DIAGNOSIS — Z23 Encounter for immunization: Secondary | ICD-10-CM

## 2017-03-24 NOTE — Addendum Note (Signed)
Addended by: Dalphine HandingGARDNER, Subrina Vecchiarelli L on: 03/24/2017 11:21 AM   Modules accepted: Orders

## 2017-03-24 NOTE — Progress Notes (Signed)
Prenatal Visit Note Date: 03/24/2017 Clinic: [facility]  Subjective:  Annette Benitez is a 31 y.o. [id number] at 7136w4d being seen today for ongoing prenatal care.  She is currently monitored for the following issues for this low-risk pregnancy and has Supervision of other normal pregnancy, antepartum; Hidradenitis; and Maternal iron deficiency anemia affecting pregnancy in third trimester, antepartum on her problem list.  Patient reports no complaints.   Contractions: Not present. Vag. Bleeding: None.  Movement: Present. Denies leaking of fluid.   The following portions of the patient's history were reviewed and updated as appropriate: allergies, current medications, past family history, past medical history, past social history, past surgical history and problem list. Problem list updated.  Objective:   Vitals:   03/24/17 1057  BP: 116/70  Pulse: 99  Weight: 194 lb (88 kg)    Fetal Status: Fetal Heart Rate (bpm): 155 Fundal Height: 32 cm Movement: Present     General:  Alert, oriented and cooperative. Patient is in no acute distress.  Skin: Skin is warm and dry. No rash noted.   Cardiovascular: Normal heart rate noted  Respiratory: Normal respiratory effort, no problems with respiration noted  Abdomen: Soft, gravid, appropriate for gestational age. Pain/Pressure: Absent     Pelvic:  Cervical exam deferred        Extremities: Normal range of motion.  Edema: None  Mental Status: Normal mood and affect. Normal behavior. Normal judgment and thought content.   Urinalysis:      Assessment and Plan:  Pregnancy: [id number] at 8436w4d  1. Supervision of other normal pregnancy, antepartum Routine care. nuva ring. Pt to start extra iron  2. Hidradenitis Had been off the humira for about a month and taking it qweek and recently took another one to make sure s/s are continue to be controlled. D/w her recommendation to stop now to ensure it washes out of her and baby's system  by birth and pt is fine with this. Pt plans to formula feed.   Preterm labor symptoms and general obstetric precautions including but not limited to vaginal bleeding, contractions, leaking of fluid and fetal movement were reviewed in detail with the patient. Please refer to After Visit Summary for other counseling recommendations.  Return in about 2 weeks (around 04/07/2017).   Sabin BingPickens, Deverick Pruss, MD

## 2017-04-10 ENCOUNTER — Ambulatory Visit (INDEPENDENT_AMBULATORY_CARE_PROVIDER_SITE_OTHER): Payer: Federal, State, Local not specified - PPO | Admitting: Obstetrics and Gynecology

## 2017-04-10 VITALS — BP 111/69 | HR 105 | Wt 193.0 lb

## 2017-04-10 DIAGNOSIS — Z3480 Encounter for supervision of other normal pregnancy, unspecified trimester: Secondary | ICD-10-CM

## 2017-04-10 DIAGNOSIS — Z3483 Encounter for supervision of other normal pregnancy, third trimester: Secondary | ICD-10-CM

## 2017-04-10 NOTE — Progress Notes (Signed)
   PRENATAL VISIT NOTE  Subjective:  Annette Benitez is a 31 y.o. [id number] at 9085w0d being seen today for ongoing prenatal care.  She is currently monitored for the following issues for this low-risk pregnancy and has Supervision of other normal pregnancy, antepartum; Hidradenitis; and Maternal iron deficiency anemia affecting pregnancy in third trimester, antepartum on her problem list.  Patient reports no complaints.  Contractions: Not present.  .  Movement: Present. Denies leaking of fluid.   The following portions of the patient's history were reviewed and updated as appropriate: allergies, current medications, past family history, past medical history, past social history, past surgical history and problem list. Problem list updated.  Objective:   Vitals:   04/10/17 1447  BP: 111/69  Pulse: (!) 105  Weight: 193 lb (87.5 kg)    Fetal Status: Fetal Heart Rate (bpm): 146 Fundal Height: 33 cm Movement: Present     General:  Alert, oriented and cooperative. Patient is in no acute distress.  Skin: Skin is warm and dry. No rash noted.   Cardiovascular: Normal heart rate noted  Respiratory: Normal respiratory effort, no problems with respiration noted  Abdomen: Soft, gravid, appropriate for gestational age.  Pain/Pressure: Present     Pelvic: Cervical exam deferred        Extremities: Normal range of motion.  Edema: None  Mental Status:  Normal mood and affect. Normal behavior. Normal judgment and thought content.   Assessment and Plan:  Pregnancy: [id number] at 785w0d  1. Supervision of other normal pregnancy, antepartum Patient is doing well without complaints Continue iron supplementation  Preterm labor symptoms and general obstetric precautions including but not limited to vaginal bleeding, contractions, leaking of fluid and fetal movement were reviewed in detail with the patient. Please refer to After Visit Summary for other counseling recommendations.  Return in about 2 weeks  (around 04/24/2017) for ROB.   Catalina AntiguaPeggy Bailey Kolbe, MD

## 2017-04-26 ENCOUNTER — Ambulatory Visit (INDEPENDENT_AMBULATORY_CARE_PROVIDER_SITE_OTHER): Payer: Federal, State, Local not specified - PPO | Admitting: Obstetrics & Gynecology

## 2017-04-26 ENCOUNTER — Encounter: Payer: Self-pay | Admitting: Obstetrics & Gynecology

## 2017-04-26 VITALS — BP 114/74 | HR 101 | Wt 193.0 lb

## 2017-04-26 DIAGNOSIS — Z113 Encounter for screening for infections with a predominantly sexual mode of transmission: Secondary | ICD-10-CM | POA: Diagnosis not present

## 2017-04-26 DIAGNOSIS — Z3480 Encounter for supervision of other normal pregnancy, unspecified trimester: Secondary | ICD-10-CM

## 2017-04-26 DIAGNOSIS — Z3483 Encounter for supervision of other normal pregnancy, third trimester: Secondary | ICD-10-CM

## 2017-04-26 NOTE — Patient Instructions (Signed)
Third Trimester of Pregnancy The third trimester is from week 28 through week 40 (months 7 through 9). The third trimester is a time when the unborn baby (fetus) is growing rapidly. At the end of the ninth month, the fetus is about 20 inches in length and weighs 6-10 pounds. Body changes during your third trimester Your body will continue to go through many changes during pregnancy. The changes vary from woman to woman. During the third trimester:  Your weight will continue to increase. You can expect to gain 25-35 pounds (11-16 kg) by the end of the pregnancy.  You may begin to get stretch marks on your hips, abdomen, and breasts.  You may urinate more often because the fetus is moving lower into your pelvis and pressing on your bladder.  You may develop or continue to have heartburn. This is caused by increased hormones that slow down muscles in the digestive tract.  You may develop or continue to have constipation because increased hormones slow digestion and cause the muscles that push waste through your intestines to relax.  You may develop hemorrhoids. These are swollen veins (varicose veins) in the rectum that can itch or be painful.  You may develop swollen, bulging veins (varicose veins) in your legs.  You may have increased body aches in the pelvis, back, or thighs. This is due to weight gain and increased hormones that are relaxing your joints.  You may have changes in your hair. These can include thickening of your hair, rapid growth, and changes in texture. Some women also have hair loss during or after pregnancy, or hair that feels dry or thin. Your hair will most likely return to normal after your baby is born.  Your breasts will continue to grow and they will continue to become tender. A yellow fluid (colostrum) may leak from your breasts. This is the first milk you are producing for your baby.  Your belly button may stick out.  You may notice more swelling in your hands,  face, or ankles.  You may have increased tingling or numbness in your hands, arms, and legs. The skin on your belly may also feel numb.  You may feel short of breath because of your expanding uterus.  You may have more problems sleeping. This can be caused by the size of your belly, increased need to urinate, and an increase in your body's metabolism.  You may notice the fetus "dropping," or moving lower in your abdomen (lightening).  You may have increased vaginal discharge.  You may notice your joints feel loose and you may have pain around your pelvic bone.  What to expect at prenatal visits You will have prenatal exams every 2 weeks until week 36. Then you will have weekly prenatal exams. During a routine prenatal visit:  You will be weighed to make sure you and the baby are growing normally.  Your blood pressure will be taken.  Your abdomen will be measured to track your baby's growth.  The fetal heartbeat will be listened to.  Any test results from the previous visit will be discussed.  You may have a cervical check near your due date to see if your cervix has softened or thinned (effaced).  You will be tested for Group B streptococcus. This happens between 35 and 37 weeks.  Your health care provider may ask you:  What your birth plan is.  How you are feeling.  If you are feeling the baby move.  If you have had   any abnormal symptoms, such as leaking fluid, bleeding, severe headaches, or abdominal cramping.  If you are using any tobacco products, including cigarettes, chewing tobacco, and electronic cigarettes.  If you have any questions.  Other tests or screenings that may be performed during your third trimester include:  Blood tests that check for low iron levels (anemia).  Fetal testing to check the health, activity level, and growth of the fetus. Testing is done if you have certain medical conditions or if there are problems during the  pregnancy.  Nonstress test (NST). This test checks the health of your baby to make sure there are no signs of problems, such as the baby not getting enough oxygen. During this test, a belt is placed around your belly. The baby is made to move, and its heart rate is monitored during movement.  What is false labor? False labor is a condition in which you feel small, irregular tightenings of the muscles in the womb (contractions) that usually go away with rest, changing position, or drinking water. These are called Braxton Hicks contractions. Contractions may last for hours, days, or even weeks before true labor sets in. If contractions come at regular intervals, become more frequent, increase in intensity, or become painful, you should see your health care provider. What are the signs of labor?  Abdominal cramps.  Regular contractions that start at 10 minutes apart and become stronger and more frequent with time.  Contractions that start on the top of the uterus and spread down to the lower abdomen and back.  Increased pelvic pressure and dull back pain.  A watery or bloody mucus discharge that comes from the vagina.  Leaking of amniotic fluid. This is also known as your "water breaking." It could be a slow trickle or a gush. Let your health care provider know if it has a color or strange odor. If you have any of these signs, call your health care provider right away, even if it is before your due date. Follow these instructions at home: Medicines  Follow your health care provider's instructions regarding medicine use. Specific medicines may be either safe or unsafe to take during pregnancy.  Take a prenatal vitamin that contains at least 600 micrograms (mcg) of folic acid.  If you develop constipation, try taking a stool softener if your health care provider approves. Eating and drinking  Eat a balanced diet that includes fresh fruits and vegetables, whole grains, good sources of protein  such as meat, eggs, or tofu, and low-fat dairy. Your health care provider will help you determine the amount of weight gain that is right for you.  Avoid raw meat and uncooked cheese. These carry germs that can cause birth defects in the baby.  If you have low calcium intake from food, talk to your health care provider about whether you should take a daily calcium supplement.  Eat four or five small meals rather than three large meals a day.  Limit foods that are high in fat and processed sugars, such as fried and sweet foods.  To prevent constipation: ? Drink enough fluid to keep your urine clear or pale yellow. ? Eat foods that are high in fiber, such as fresh fruits and vegetables, whole grains, and beans. Activity  Exercise only as directed by your health care provider. Most women can continue their usual exercise routine during pregnancy. Try to exercise for 30 minutes at least 5 days a week. Stop exercising if you experience uterine contractions.  Avoid heavy   lifting.  Do not exercise in extreme heat or humidity, or at high altitudes.  Wear low-heel, comfortable shoes.  Practice good posture.  You may continue to have sex unless your health care provider tells you otherwise. Relieving pain and discomfort  Take frequent breaks and rest with your legs elevated if you have leg cramps or low back pain.  Take warm sitz baths to soothe any pain or discomfort caused by hemorrhoids. Use hemorrhoid cream if your health care provider approves.  Wear a good support bra to prevent discomfort from breast tenderness.  If you develop varicose veins: ? Wear support pantyhose or compression stockings as told by your healthcare provider. ? Elevate your feet for 15 minutes, 3-4 times a day. Prenatal care  Write down your questions. Take them to your prenatal visits.  Keep all your prenatal visits as told by your health care provider. This is important. Safety  Wear your seat belt at  all times when driving.  Make a list of emergency phone numbers, including numbers for family, friends, the hospital, and police and fire departments. General instructions  Avoid cat litter boxes and soil used by cats. These carry germs that can cause birth defects in the baby. If you have a cat, ask someone to clean the litter box for you.  Do not travel far distances unless it is absolutely necessary and only with the approval of your health care provider.  Do not use hot tubs, steam rooms, or saunas.  Do not drink alcohol.  Do not use any products that contain nicotine or tobacco, such as cigarettes and e-cigarettes. If you need help quitting, ask your health care provider.  Do not use any medicinal herbs or unprescribed drugs. These chemicals affect the formation and growth of the baby.  Do not douche or use tampons or scented sanitary pads.  Do not cross your legs for long periods of time.  To prepare for the arrival of your baby: ? Take prenatal classes to understand, practice, and ask questions about labor and delivery. ? Make a trial run to the hospital. ? Visit the hospital and tour the maternity area. ? Arrange for maternity or paternity leave through employers. ? Arrange for family and friends to take care of pets while you are in the hospital. ? Purchase a rear-facing car seat and make sure you know how to install it in your car. ? Pack your hospital bag. ? Prepare the baby's nursery. Make sure to remove all pillows and stuffed animals from the baby's crib to prevent suffocation.  Visit your dentist if you have not gone during your pregnancy. Use a soft toothbrush to brush your teeth and be gentle when you floss. Contact a health care provider if:  You are unsure if you are in labor or if your water has broken.  You become dizzy.  You have mild pelvic cramps, pelvic pressure, or nagging pain in your abdominal area.  You have lower back pain.  You have persistent  nausea, vomiting, or diarrhea.  You have an unusual or bad smelling vaginal discharge.  You have pain when you urinate. Get help right away if:  Your water breaks before 37 weeks.  You have regular contractions less than 5 minutes apart before 37 weeks.  You have a fever.  You are leaking fluid from your vagina.  You have spotting or bleeding from your vagina.  You have severe abdominal pain or cramping.  You have rapid weight loss or weight gain.    You have shortness of breath with chest pain.  You notice sudden or extreme swelling of your face, hands, ankles, feet, or legs.  Your baby makes fewer than 10 movements in 2 hours.  You have severe headaches that do not go away when you take medicine.  You have vision changes. Summary  The third trimester is from week 28 through week 40, months 7 through 9. The third trimester is a time when the unborn baby (fetus) is growing rapidly.  During the third trimester, your discomfort may increase as you and your baby continue to gain weight. You may have abdominal, leg, and back pain, sleeping problems, and an increased need to urinate.  During the third trimester your breasts will keep growing and they will continue to become tender. A yellow fluid (colostrum) may leak from your breasts. This is the first milk you are producing for your baby.  False labor is a condition in which you feel small, irregular tightenings of the muscles in the womb (contractions) that eventually go away. These are called Braxton Hicks contractions. Contractions may last for hours, days, or even weeks before true labor sets in.  Signs of labor can include: abdominal cramps; regular contractions that start at 10 minutes apart and become stronger and more frequent with time; watery or bloody mucus discharge that comes from the vagina; increased pelvic pressure and dull back pain; and leaking of amniotic fluid. This information is not intended to replace advice  given to you by your health care provider. Make sure you discuss any questions you have with your health care provider. Document Released: 08/23/2001 Document Revised: 02/04/2016 Document Reviewed: 10/30/2012 Elsevier Interactive Patient Education  2017 [employer]  

## 2017-04-26 NOTE — Progress Notes (Signed)
   PRENATAL VISIT NOTE  Subjective:  Annette Benitez is a 31 y.o. [phone] at 2075w2d being seen today for ongoing prenatal care.  She is currently monitored for the following issues for this low-risk pregnancy and has Supervision of other normal pregnancy, antepartum; Hidradenitis; and Maternal iron deficiency anemia affecting pregnancy in third trimester, antepartum on her problem list.  Patient reports active hidradenitis lesions on vulva.  Contractions: Not present. Vag. Bleeding: None.  Movement: Present. Denies leaking of fluid.   The following portions of the patient's history were reviewed and updated as appropriate: allergies, current medications, past family history, past medical history, past social history, past surgical history and problem list. Problem list updated.  Objective:   Vitals:   04/26/17 1458  BP: 114/74  Pulse: (!) 101  Weight: 193 lb (87.5 kg)    Fetal Status: Fetal Heart Rate (bpm): 135   Movement: Present     General:  Alert, oriented and cooperative. Patient is in no acute distress.  Skin: Skin is warm and dry. No rash noted.   Cardiovascular: Normal heart rate noted  Respiratory: Normal respiratory effort, no problems with respiration noted  Abdomen: Soft, gravid, appropriate for gestational age.  Pain/Pressure: Present     Pelvic: Cervical exam performed        Extremities: Normal range of motion.  Edema: Trace  Mental Status:  Normal mood and affect. Normal behavior. Normal judgment and thought content.   Assessment and Plan:  Pregnancy: [id number] at 2375w2d  1. Supervision of other normal pregnancy, antepartum  - Strep Gp B NAA - Cervicovaginal ancillary only  Preterm labor symptoms and general obstetric precautions including but not limited to vaginal bleeding, contractions, leaking of fluid and fetal movement were reviewed in detail with the patient. Please refer to After Visit Summary for other counseling recommendations.  Return in about 1 week  (around 05/03/2017).   Scheryl DarterJames Sakeenah Valcarcel, MD

## 2017-04-26 NOTE — Progress Notes (Signed)
ROB GBS 

## 2017-04-27 LAB — CERVICOVAGINAL ANCILLARY ONLY
CHLAMYDIA, DNA PROBE: NEGATIVE
Neisseria Gonorrhea: NEGATIVE

## 2017-04-28 LAB — STREP GP B NAA: STREP GROUP B AG: NEGATIVE

## 2017-05-03 ENCOUNTER — Encounter: Payer: Self-pay | Admitting: Certified Nurse Midwife

## 2017-05-03 ENCOUNTER — Ambulatory Visit (INDEPENDENT_AMBULATORY_CARE_PROVIDER_SITE_OTHER): Payer: Federal, State, Local not specified - PPO | Admitting: Certified Nurse Midwife

## 2017-05-03 VITALS — BP 111/75 | HR 99 | Wt 189.4 lb

## 2017-05-03 DIAGNOSIS — Z3483 Encounter for supervision of other normal pregnancy, third trimester: Secondary | ICD-10-CM

## 2017-05-03 DIAGNOSIS — D509 Iron deficiency anemia, unspecified: Secondary | ICD-10-CM

## 2017-05-03 DIAGNOSIS — O99013 Anemia complicating pregnancy, third trimester: Secondary | ICD-10-CM

## 2017-05-03 DIAGNOSIS — Z3480 Encounter for supervision of other normal pregnancy, unspecified trimester: Secondary | ICD-10-CM

## 2017-05-03 NOTE — Progress Notes (Signed)
Patient has questions about her HS inflammation during delivery- and not being on her medication.

## 2017-05-03 NOTE — Progress Notes (Signed)
   PRENATAL VISIT NOTE  Subjective:  Annette Benitez is a 31 y.o. [phone] at [redacted]w[redacted]d being seen today for ongoing prenatal care.  She is currently monitored for the following issues for this low-risk pregnancy and has Supervision of other normal pregnancy, antepartum; Hidradenitis; and Maternal iron deficiency anemia affecting pregnancy in third trimester, antepartum on her problem list.  Patient reports no complaints.  Contractions: Irregular. Vag. Bleeding: None.  Movement: Present. Denies leaking of fluid.   The following portions of the patient's history were reviewed and updated as appropriate: allergies, current medications, past family history, past medical history, past social history, past surgical history and problem list. Problem list updated.  Objective:   Vitals:   05/03/17 1526  BP: 111/75  Pulse: 99  Weight: 189 lb 6.4 oz (85.9 kg)    Fetal Status: Fetal Heart Rate (bpm): 137 Fundal Height: 36 cm Movement: Present     General:  Alert, oriented and cooperative. Patient is in no acute distress.  Skin: Skin is warm and dry. No rash noted.   Cardiovascular: Normal heart rate noted  Respiratory: Normal respiratory effort, no problems with respiration noted  Abdomen: Soft, gravid, appropriate for gestational age.  Pain/Pressure: Present     Pelvic: Cervical exam deferred        Extremities: Normal range of motion.  Edema: None  Mental Status:  Normal mood and affect. Normal behavior. Normal judgment and thought content.   Assessment and Plan:  Pregnancy: [id number] at [redacted]w[redacted]d  1. Supervision of other normal pregnancy, antepartum     Doing well.  Has been off of Humira for 6 weeks.  Worried about abscesses, encouragement given.  No active infection currently.    2. Maternal iron deficiency anemia affecting pregnancy in third trimester, antepartum      Taking bloom.   - CBC  Preterm labor symptoms and general obstetric precautions including but not limited to vaginal bleeding,  contractions, leaking of fluid and fetal movement were reviewed in detail with the patient. Please refer to After Visit Summary for other counseling recommendations.  Return in about 1 week (around 05/10/2017) for ROB.   Roe Coombs, CNM

## 2017-05-04 ENCOUNTER — Other Ambulatory Visit: Payer: Self-pay | Admitting: Certified Nurse Midwife

## 2017-05-04 LAB — CBC
HEMATOCRIT: 30.3 % — AB (ref 34.0–46.6)
Hemoglobin: 9.7 g/dL — ABNORMAL LOW (ref 11.1–15.9)
MCH: 28.5 pg (ref 26.6–33.0)
MCHC: 32 g/dL (ref 31.5–35.7)
MCV: 89 fL (ref 79–97)
PLATELETS: 276 10*3/uL (ref 150–379)
RBC: 3.4 x10E6/uL — AB (ref 3.77–5.28)
RDW: 14.7 % (ref 12.3–15.4)
WBC: 12 10*3/uL — ABNORMAL HIGH (ref 3.4–10.8)

## 2017-05-10 ENCOUNTER — Encounter: Payer: Self-pay | Admitting: Certified Nurse Midwife

## 2017-05-10 ENCOUNTER — Ambulatory Visit (INDEPENDENT_AMBULATORY_CARE_PROVIDER_SITE_OTHER): Payer: Federal, State, Local not specified - PPO | Admitting: Certified Nurse Midwife

## 2017-05-10 VITALS — BP 108/73 | HR 103 | Wt 192.0 lb

## 2017-05-10 DIAGNOSIS — Z3480 Encounter for supervision of other normal pregnancy, unspecified trimester: Secondary | ICD-10-CM

## 2017-05-10 DIAGNOSIS — O99013 Anemia complicating pregnancy, third trimester: Secondary | ICD-10-CM

## 2017-05-10 DIAGNOSIS — D509 Iron deficiency anemia, unspecified: Secondary | ICD-10-CM

## 2017-05-10 NOTE — Progress Notes (Signed)
   PRENATAL VISIT NOTE  Subjective:  Annette Benitez is a 31 y.o. [phone] at 8231w2d being seen today for ongoing prenatal care.  She is currently monitored for the following issues for this low-risk pregnancy and has Supervision of other normal pregnancy, antepartum; Hidradenitis; and Maternal iron deficiency anemia affecting pregnancy in third trimester, antepartum on her problem list.  Patient reports no complaints.  Contractions: Irregular. Vag. Bleeding: None.  Movement: Present. Denies leaking of fluid.   The following portions of the patient's history were reviewed and updated as appropriate: allergies, current medications, past family history, past medical history, past social history, past surgical history and problem list. Problem list updated.  Objective:   Vitals:   05/10/17 1036  BP: 108/73  Pulse: (!) 103  Weight: 192 lb (87.1 kg)    Fetal Status: Fetal Heart Rate (bpm): 142; doppler Fundal Height: 37 cm Movement: Present     General:  Alert, oriented and cooperative. Patient is in no acute distress.  Skin: Skin is warm and dry. No rash noted.   Cardiovascular: Normal heart rate noted  Respiratory: Normal respiratory effort, no problems with respiration noted  Abdomen: Soft, gravid, appropriate for gestational age.  Pain/Pressure: Present     Pelvic: Cervical exam deferred        Extremities: Normal range of motion.  Edema: None  Mental Status:  Normal mood and affect. Normal behavior. Normal judgment and thought content.   Assessment and Plan:  Pregnancy: [id number] at 4131w2d  1. Supervision of other normal pregnancy, antepartum      Doing well. Working currently.    2. Maternal iron deficiency anemia affecting pregnancy in third trimester, antepartum     Taking OTC iron.   Term labor symptoms and general obstetric precautions including but not limited to vaginal bleeding, contractions, leaking of fluid and fetal movement were reviewed in detail with the  patient. Please refer to After Visit Summary for other counseling recommendations.  Return in about 1 week (around 05/17/2017) for ROB.   Roe Coombsachelle A Saharsh Sterling, CNM

## 2017-05-18 ENCOUNTER — Ambulatory Visit (INDEPENDENT_AMBULATORY_CARE_PROVIDER_SITE_OTHER): Payer: Federal, State, Local not specified - PPO | Admitting: Certified Nurse Midwife

## 2017-05-18 VITALS — BP 115/73 | HR 98 | Wt 192.2 lb

## 2017-05-18 DIAGNOSIS — Z3480 Encounter for supervision of other normal pregnancy, unspecified trimester: Secondary | ICD-10-CM

## 2017-05-18 DIAGNOSIS — Z3483 Encounter for supervision of other normal pregnancy, third trimester: Secondary | ICD-10-CM

## 2017-05-18 DIAGNOSIS — O99013 Anemia complicating pregnancy, third trimester: Secondary | ICD-10-CM

## 2017-05-18 DIAGNOSIS — D509 Iron deficiency anemia, unspecified: Secondary | ICD-10-CM

## 2017-05-18 NOTE — Progress Notes (Signed)
Patient reports good fetal movement with occasional pressure. 

## 2017-05-18 NOTE — Progress Notes (Signed)
   PRENATAL VISIT NOTE  Subjective:  Annette Benitez is a 31 y.o. [id number] at 7448w3d being seen today for ongoing prenatal care.  She is currently monitored for the following issues for this low-risk pregnancy and has Supervision of other normal pregnancy, antepartum; Hidradenitis; and Maternal iron deficiency anemia affecting pregnancy in third trimester, antepartum on her problem list.  Patient reports no complaints.  Contractions: Irregular. Vag. Bleeding: None.  Movement: Present. Denies leaking of fluid.   The following portions of the patient's history were reviewed and updated as appropriate: allergies, current medications, past family history, past medical history, past social history, past surgical history and problem list. Problem list updated.  Objective:   Vitals:   05/18/17 1542  BP: 115/73  Pulse: 98  Weight: 192 lb 3.2 oz (87.2 kg)    Fetal Status: Fetal Heart Rate (bpm): 138; doppler Fundal Height: 34 cm Movement: Present     General:  Alert, oriented and cooperative. Patient is in no acute distress.  Skin: Skin is warm and dry. No rash noted.   Cardiovascular: Normal heart rate noted  Respiratory: Normal respiratory effort, no problems with respiration noted  Abdomen: Soft, gravid, appropriate for gestational age.  Pain/Pressure: Present     Pelvic: Cervical exam deferred        Extremities: Normal range of motion.  Edema: Trace  Mental Status:  Normal mood and affect. Normal behavior. Normal judgment and thought content.   Assessment and Plan:  Pregnancy: [id number] at 948w3d  1. Supervision of other normal pregnancy, antepartum      Doing well.    2. Maternal iron deficiency anemia affecting pregnancy in third trimester, antepartum      Taking Bloom.  Stable.    Preterm labor symptoms and general obstetric precautions including but not limited to vaginal bleeding, contractions, leaking of fluid and fetal movement were reviewed in detail with the patient. Please  refer to After Visit Summary for other counseling recommendations.  Return in about 1 week (around 05/25/2017) for ROB.   Roe Coombsachelle A Degan Hanser, CNM

## 2017-05-25 ENCOUNTER — Ambulatory Visit (INDEPENDENT_AMBULATORY_CARE_PROVIDER_SITE_OTHER): Payer: Federal, State, Local not specified - PPO | Admitting: Certified Nurse Midwife

## 2017-05-25 VITALS — BP 119/75 | HR 109 | Wt 191.3 lb

## 2017-05-25 DIAGNOSIS — Z3483 Encounter for supervision of other normal pregnancy, third trimester: Secondary | ICD-10-CM

## 2017-05-25 DIAGNOSIS — O99013 Anemia complicating pregnancy, third trimester: Secondary | ICD-10-CM

## 2017-05-25 DIAGNOSIS — D509 Iron deficiency anemia, unspecified: Secondary | ICD-10-CM

## 2017-05-25 DIAGNOSIS — Z3480 Encounter for supervision of other normal pregnancy, unspecified trimester: Secondary | ICD-10-CM

## 2017-05-25 NOTE — Progress Notes (Signed)
Patient is in the office for ob visit, reports fetal movement and pressure.

## 2017-05-26 NOTE — Progress Notes (Signed)
   PRENATAL VISIT NOTE  SubjectIzabellah Dadismanise Benitez is a 31 y.o. [id number] at [redacted]w[redacted]d being seen today for ongoing prenatal care.  She is currently monitored for the following issues for this low-risk pregnancy and has Supervision of other normal pregnancy, antepartum; Hidradenitis; and Maternal iron deficiency anemia affecting pregnancy in third trimester, antepartum on her problem list.  Patient reports no complaints.  Contractions: Irregular. Vag. Bleeding: None.  Movement: Present. Denies leaking of fluid.   The following portions of the patient's history were reviewed and updated as appropriate: allergies, current medications, past family history, past medical history, past social history, past surgical history and problem list. Problem list updated.  Objective:   Vitals:   05/25/17 1347  BP: 119/75  Pulse: (!) 109  Weight: 191 lb 4.8 oz (86.8 kg)    Fetal Status: Fetal Heart Rate (bpm): 143; doppler Fundal Height: 34 cm Movement: Present  Presentation: Vertex  General:  Alert, oriented and cooperative. Patient is in no acute distress.  Skin: Skin is warm and dry. No rash noted.   Cardiovascular: Normal heart rate noted  Respiratory: Normal respiratory effort, no problems with respiration noted  Abdomen: Soft, gravid, appropriate for gestational age.  Pain/Pressure: Present     Pelvic: Cervical exam performed Dilation: 1 Effacement (%): 0 Station: -3  Extremities: Normal range of motion.  Edema: Trace  Mental Status:  Normal mood and affect. Normal behavior. Normal judgment and thought content.   Assessment and Plan:  Pregnancy: [id number] at [redacted]w[redacted]d  1. Supervision of other normal pregnancy, antepartum     Doing well.  IOL scheduled.   2. Maternal iron deficiency anemia affecting pregnancy in third trimester, antepartum     Taking Bloom  Term labor symptoms and general obstetric precautions including but not limited to vaginal bleeding, contractions, leaking of fluid and fetal  movement were reviewed in detail with the patient. Please refer to After Visit Summary for other counseling recommendations.  Return in about 1 week (around 06/01/2017) for ROB, NST.   Roe Coombs, CNM

## 2017-05-30 ENCOUNTER — Telehealth (HOSPITAL_COMMUNITY): Payer: Self-pay | Admitting: *Deleted

## 2017-05-30 ENCOUNTER — Encounter (HOSPITAL_COMMUNITY): Payer: Self-pay | Admitting: *Deleted

## 2017-05-30 NOTE — Telephone Encounter (Signed)
Preadmission screen  

## 2017-06-01 ENCOUNTER — Encounter: Payer: Self-pay | Admitting: Certified Nurse Midwife

## 2017-06-01 ENCOUNTER — Ambulatory Visit (INDEPENDENT_AMBULATORY_CARE_PROVIDER_SITE_OTHER): Payer: Federal, State, Local not specified - PPO | Admitting: Certified Nurse Midwife

## 2017-06-01 VITALS — BP 123/81 | HR 98 | Wt 193.0 lb

## 2017-06-01 DIAGNOSIS — O99013 Anemia complicating pregnancy, third trimester: Secondary | ICD-10-CM

## 2017-06-01 DIAGNOSIS — D509 Iron deficiency anemia, unspecified: Secondary | ICD-10-CM

## 2017-06-01 DIAGNOSIS — Z3480 Encounter for supervision of other normal pregnancy, unspecified trimester: Secondary | ICD-10-CM

## 2017-06-01 NOTE — Progress Notes (Signed)
   PRENATAL VISIT NOTE  Subjective:  Annette Benitez is a 31 y.o. [id number] at [redacted]w[redacted]d being seen today for ongoing prenatal care.  She is currently monitored for the following issues for this low-risk pregnancy and has Supervision of other normal pregnancy, antepartum; Hidradenitis; and Maternal iron deficiency anemia affecting pregnancy in third trimester, antepartum on her problem list.  Patient reports no bleeding, no leaking and occasional contractions.  Contractions: Irregular. Vag. Bleeding: None.  Movement: Present. Denies leaking of fluid.   The following portions of the patient's history were reviewed and updated as appropriate: allergies, current medications, past family history, past medical history, past social history, past surgical history and problem list. Problem list updated.  Objective:   Vitals:   06/01/17 1351  BP: 123/81  Pulse: 98  Weight: 193 lb (87.5 kg)    Fetal Status: Fetal Heart Rate (bpm): NST; reactive Fundal Height: 34 cm Movement: Present  Presentation: Vertex  General:  Alert, oriented and cooperative. Patient is in no acute distress.  Skin: Skin is warm and dry. No rash noted.   Cardiovascular: Normal heart rate noted  Respiratory: Normal respiratory effort, no problems with respiration noted  Abdomen: Soft, gravid, appropriate for gestational age.  Pain/Pressure: Present     Pelvic: Cervical exam performed Dilation: 1.5 Effacement (%): Thick Station: -3  Extremities: Normal range of motion.  Edema: Trace  Mental Status:  Normal mood and affect. Normal behavior. Normal judgment and thought content.  NST: + accels, no decels, moderate variability, Cat. 1 tracing. No contractions on toco.   Assessment and Plan:  Pregnancy: [id number] at [redacted]w[redacted]d  1. Maternal iron deficiency anemia affecting pregnancy in third trimester, antepartum     Taking Bloom  2. Supervision of other normal pregnancy, antepartum      Doing well. Reactive NST at post-dates.  Has IOL  scheduled.   Term labor symptoms and general obstetric precautions including but not limited to vaginal bleeding, contractions, leaking of fluid and fetal movement were reviewed in detail with the patient. Please refer to After Visit Summary for other counseling recommendations.  Return in about 4 weeks (around 06/29/2017) for Postpartum.   Roe Coombs, CNM

## 2017-06-01 NOTE — Progress Notes (Signed)
Postdates 40.3, induction scheduled 06/07/17.

## 2017-06-01 NOTE — Addendum Note (Signed)
Addended by: Maretta Bees on: 06/01/2017 02:53 PM   Modules accepted: Orders

## 2017-06-03 ENCOUNTER — Other Ambulatory Visit: Payer: Self-pay | Admitting: Advanced Practice Midwife

## 2017-06-06 ENCOUNTER — Inpatient Hospital Stay (HOSPITAL_COMMUNITY): Payer: Federal, State, Local not specified - PPO | Admitting: Anesthesiology

## 2017-06-06 ENCOUNTER — Encounter (HOSPITAL_COMMUNITY): Payer: Self-pay | Admitting: *Deleted

## 2017-06-06 ENCOUNTER — Inpatient Hospital Stay (HOSPITAL_COMMUNITY)
Admission: AD | Admit: 2017-06-06 | Discharge: 2017-06-08 | DRG: 775 | Disposition: A | Discharge status: Home / Self Care | Payer: Federal, State, Local not specified - PPO | Source: Ambulatory Visit | Attending: Family Medicine | Admitting: Family Medicine

## 2017-06-06 DIAGNOSIS — Z87891 Personal history of nicotine dependence: Secondary | ICD-10-CM | POA: Diagnosis not present

## 2017-06-06 DIAGNOSIS — O9972 Diseases of the skin and subcutaneous tissue complicating childbirth: Secondary | ICD-10-CM | POA: Diagnosis present

## 2017-06-06 DIAGNOSIS — O480 Post-term pregnancy: Secondary | ICD-10-CM

## 2017-06-06 DIAGNOSIS — D509 Iron deficiency anemia, unspecified: Secondary | ICD-10-CM | POA: Diagnosis present

## 2017-06-06 DIAGNOSIS — O9902 Anemia complicating childbirth: Secondary | ICD-10-CM | POA: Diagnosis present

## 2017-06-06 DIAGNOSIS — Z3A41 41 weeks gestation of pregnancy: Secondary | ICD-10-CM

## 2017-06-06 DIAGNOSIS — L732 Hidradenitis suppurativa: Secondary | ICD-10-CM | POA: Diagnosis present

## 2017-06-06 DIAGNOSIS — O26893 Other specified pregnancy related conditions, third trimester: Secondary | ICD-10-CM | POA: Diagnosis present

## 2017-06-06 LAB — TYPE AND SCREEN
ABO/RH(D): O POS
ANTIBODY SCREEN: NEGATIVE

## 2017-06-06 LAB — CBC
HCT: 32.4 % — ABNORMAL LOW (ref 36.0–46.0)
Hemoglobin: 10.7 g/dL — ABNORMAL LOW (ref 12.0–15.0)
MCH: 29.1 pg (ref 26.0–34.0)
MCHC: 33 g/dL (ref 30.0–36.0)
MCV: 88 fL (ref 78.0–100.0)
PLATELETS: 240 10*3/uL (ref 150–400)
RBC: 3.68 MIL/uL — ABNORMAL LOW (ref 3.87–5.11)
RDW: 15.2 % (ref 11.5–15.5)
WBC: 12.7 10*3/uL — ABNORMAL HIGH (ref 4.0–10.5)

## 2017-06-06 LAB — ABO/RH: ABO/RH(D): O POS

## 2017-06-06 MED ORDER — PHENYLEPHRINE 40 MCG/ML (10ML) SYRINGE FOR IV PUSH (FOR BLOOD PRESSURE SUPPORT)
80.0000 ug | PREFILLED_SYRINGE | INTRAVENOUS | Status: DC | PRN
  Filled 2017-06-06: qty 5

## 2017-06-06 MED ORDER — DIBUCAINE 1 % RE OINT: 1.0000 "application " | TOPICAL_OINTMENT | RECTAL | Status: DC | PRN

## 2017-06-06 MED ORDER — TETANUS-DIPHTH-ACELL PERTUSSIS 5-2.5-18.5 LF-MCG/0.5 IM SUSP: 0.5000 mL | Freq: Once | INTRAMUSCULAR | Status: DC

## 2017-06-06 MED ORDER — COCONUT OIL OIL: 1.0000 "application " | TOPICAL_OIL | Status: DC | PRN

## 2017-06-06 MED ORDER — ZOLPIDEM TARTRATE 5 MG PO TABS
5.0000 mg | ORAL_TABLET | Freq: Every evening | ORAL | Status: DC | PRN
Start: 2017-06-06 — End: 2017-06-08

## 2017-06-06 MED ORDER — OXYCODONE-ACETAMINOPHEN 5-325 MG PO TABS: 1.0000 | ORAL_TABLET | ORAL | Status: DC | PRN

## 2017-06-06 MED ORDER — OXYCODONE-ACETAMINOPHEN 5-325 MG PO TABS: 2.0000 | ORAL_TABLET | ORAL | Status: DC | PRN

## 2017-06-06 MED ORDER — FENTANYL CITRATE (PF) 100 MCG/2ML IJ SOLN: 100.0000 ug | INTRAMUSCULAR | Status: DC | PRN

## 2017-06-06 MED ORDER — PHENYLEPHRINE 40 MCG/ML (10ML) SYRINGE FOR IV PUSH (FOR BLOOD PRESSURE SUPPORT)
PREFILLED_SYRINGE | INTRAVENOUS | Status: AC
  Filled 2017-06-06: qty 10

## 2017-06-06 MED ORDER — LIDOCAINE HCL (PF) 1 % IJ SOLN
30.0000 mL | INTRAMUSCULAR | Status: DC | PRN
  Filled 2017-06-06: qty 30

## 2017-06-06 MED ORDER — EPHEDRINE 5 MG/ML INJ
10.0000 mg | INTRAVENOUS | Status: DC | PRN
  Filled 2017-06-06: qty 2

## 2017-06-06 MED ORDER — OXYTOCIN BOLUS FROM INFUSION: 500.0000 mL | Freq: Once | INTRAVENOUS | Status: DC

## 2017-06-06 MED ORDER — LIDOCAINE HCL (PF) 1 % IJ SOLN
INTRAMUSCULAR | Status: DC | PRN
  Administered 2017-06-06: 5 mL via EPIDURAL

## 2017-06-06 MED ORDER — TERBUTALINE SULFATE 1 MG/ML IJ SOLN
0.2500 mg | Freq: Once | INTRAMUSCULAR | Status: DC | PRN
  Filled 2017-06-06: qty 1

## 2017-06-06 MED ORDER — PRENATAL MULTIVITAMIN CH
1.0000 | ORAL_TABLET | Freq: Every day | ORAL | Status: DC
  Administered 2017-06-07 – 2017-06-08 (×2): 1 via ORAL
  Filled 2017-06-06 (×2): qty 1

## 2017-06-06 MED ORDER — SOD CITRATE-CITRIC ACID 500-334 MG/5ML PO SOLN: 30.0000 mL | ORAL | Status: DC | PRN

## 2017-06-06 MED ORDER — ACETAMINOPHEN 325 MG PO TABS
650.0000 mg | ORAL_TABLET | ORAL | Status: DC | PRN
  Administered 2017-06-07 (×2): 650 mg via ORAL
  Filled 2017-06-06 (×2): qty 2

## 2017-06-06 MED ORDER — SENNOSIDES-DOCUSATE SODIUM 8.6-50 MG PO TABS
2.0000 | ORAL_TABLET | ORAL | Status: DC
  Administered 2017-06-06 – 2017-06-07 (×2): 2 via ORAL
  Filled 2017-06-06 (×2): qty 2

## 2017-06-06 MED ORDER — ONDANSETRON HCL 4 MG/2ML IJ SOLN: 4.0000 mg | INTRAMUSCULAR | Status: DC | PRN

## 2017-06-06 MED ORDER — LACTATED RINGERS IV SOLN
500.0000 mL | Freq: Once | INTRAVENOUS | Status: AC
  Administered 2017-06-06: 500 mL via INTRAVENOUS

## 2017-06-06 MED ORDER — ONDANSETRON HCL 4 MG PO TABS: 4.0000 mg | ORAL_TABLET | ORAL | Status: DC | PRN

## 2017-06-06 MED ORDER — LACTATED RINGERS IV SOLN: 500.0000 mL | INTRAVENOUS | Status: DC | PRN

## 2017-06-06 MED ORDER — LACTATED RINGERS IV SOLN
INTRAVENOUS | Status: DC
  Administered 2017-06-06 (×2): via INTRAVENOUS

## 2017-06-06 MED ORDER — WITCH HAZEL-GLYCERIN EX PADS: 1.0000 "application " | MEDICATED_PAD | CUTANEOUS | Status: DC | PRN

## 2017-06-06 MED ORDER — SIMETHICONE 80 MG PO CHEW: 80.0000 mg | CHEWABLE_TABLET | ORAL | Status: DC | PRN

## 2017-06-06 MED ORDER — BENZOCAINE-MENTHOL 20-0.5 % EX AERO: 1.0000 "application " | INHALATION_SPRAY | CUTANEOUS | Status: DC | PRN

## 2017-06-06 MED ORDER — DIPHENHYDRAMINE HCL 50 MG/ML IJ SOLN: 12.5000 mg | INTRAMUSCULAR | Status: DC | PRN

## 2017-06-06 MED ORDER — DIPHENHYDRAMINE HCL 25 MG PO CAPS: 25.0000 mg | ORAL_CAPSULE | Freq: Four times a day (QID) | ORAL | Status: DC | PRN

## 2017-06-06 MED ORDER — FENTANYL 2.5 MCG/ML BUPIVACAINE 1/10 % EPIDURAL INFUSION (WH - ANES)
INTRAMUSCULAR | Status: AC
  Filled 2017-06-06: qty 100

## 2017-06-06 MED ORDER — OXYTOCIN 40 UNITS IN LACTATED RINGERS INFUSION - SIMPLE MED
2.5000 [IU]/h | INTRAVENOUS | Status: DC
  Filled 2017-06-06: qty 1000

## 2017-06-06 MED ORDER — ACETAMINOPHEN 325 MG PO TABS: 650.0000 mg | ORAL_TABLET | ORAL | Status: DC | PRN

## 2017-06-06 MED ORDER — ONDANSETRON HCL 4 MG/2ML IJ SOLN: 4.0000 mg | Freq: Four times a day (QID) | INTRAMUSCULAR | Status: DC | PRN

## 2017-06-06 MED ORDER — IBUPROFEN 600 MG PO TABS
600.0000 mg | ORAL_TABLET | Freq: Four times a day (QID) | ORAL | Status: DC
  Administered 2017-06-06 – 2017-06-08 (×7): 600 mg via ORAL
  Filled 2017-06-06 (×7): qty 1

## 2017-06-06 MED ORDER — FENTANYL 2.5 MCG/ML BUPIVACAINE 1/10 % EPIDURAL INFUSION (WH - ANES)
14.0000 mL/h | INTRAMUSCULAR | Status: DC | PRN
  Administered 2017-06-06 (×2): 14 mL/h via EPIDURAL
  Filled 2017-06-06: qty 100

## 2017-06-06 MED ORDER — OXYTOCIN 40 UNITS IN LACTATED RINGERS INFUSION - SIMPLE MED
1.0000 m[IU]/min | INTRAVENOUS | Status: DC
  Administered 2017-06-06: 2 m[IU]/min via INTRAVENOUS

## 2017-06-06 NOTE — H&P (Signed)
LABOR ADMISSION HISTORY AND PHYSICAL  Annette Benitez is a 31 y.o. female [phone] with IUP at [redacted]w[redacted]d by LMP presenting for SOL. She reports +FMs, No LOF, no VB, no blurry vision, no headaches, no peripheral edema, and no RUQ pain.  She plans on bottle feeding. She requests Nuvaring for birth control.  Dating: By LMP --->  Estimated Date of Delivery: 05/29/17  Sono:   , CWD, normal anatomy, transverse presentation  Patient initiated prenatal care at 11 weeks at [facility]  Prenatal History/Complications: Anemia in third trimester, taking Bloom Hidradenitis suppurativa  Past Medical History: Past Medical History:  Diagnosis Date  . Hidradenitis     Past Surgical History: Past Surgical History:  Procedure Laterality Date  . NO PAST SURGERIES      Obstetrical History: OB History    Gravida Para Term Preterm AB Living   SAB TAB Ectopic Multiple Live Births     2     2      Social History: Social History   Social History  . Marital status: Married    Spouse name: N/A  . Number of children: N/A  . Years of education: N/A   Social History Main Topics  . Smoking status: Former Games developer  . Smokeless tobacco: Never Used  . Alcohol use No     Comment: occasionally  . Drug use: No  . Sexual activity: Yes    Birth control/ protection: None   Other Topics Concern  . None   Social History Narrative  . None    Family History: Family History  Problem Relation Age of Onset  . Hypertension Father     Allergies: No Known Allergies  Prescriptions Prior to Admission  Medication Sig Dispense Refill Last Dose  . Fe Cbn-Fe Gluc-FA-B12-C-DSS (FERRALET 90) 90-1 MG TABS TK 1 T PO D  5 06/05/2017 at Unknown time  . Prenatal-DSS-FeCb-FeGl-FA (CITRANATAL BLOOM) 90-1 MG TABS Take 1 tablet by mouth daily. 30 tablet 12 06/05/2017 at Unknown time  . Adalimumab (HUMIRA) 40 MG/0.8ML PSKT Inject into the skin.   Not Taking  . clindamycin (CLINDAGEL) 1 % gel Apply topically 2  (two) times daily. (Patient not taking: Reported on 05/18/2017) 60 g 2 Not Taking at Unknown time     Review of Systems   All systems reviewed and negative except as stated in HPI  Blood pressure 131/81, pulse 94, temperature 97.7 F (36.5 C), temperature source Oral, resp. rate 18, weight 86.7 kg (191 lb 1.3 oz), last menstrual period 08/22/2016, SpO2 100 %. General appearance: alert, cooperative, appears stated age and no distress Lungs: normal work of breathing Extremities: Homans sign is negative, no sign of DVT Presentation: cephalic Fetal monitoringBaseline: 150 bpm, Variability: Good {> 6 bpm), Accelerations: Reactive and Decelerations: Absent Uterine activityFrequency: Every 3-4 minutes Dilation: 3.5 Effacement (%): 80 Station: -2 Exam by:: ginger morris rn   Prenatal labs: ABO, Rh: O/Positive/-- (03/02 1046) Antibody: Negative (03/02 1046) Rubella: Immune RPR: Non Reactive (06/21 1040)  HBsAg: Negative (03/02 1046)  HIV:  Non Reactive  GBS: Negative (08/15 1546)  GTT: Fasting- 81, 1 hr- 131, 2 hr- 112  Prenatal Transfer Tool  Maternal Diabetes: No Genetic Screening: Normal Maternal Ultrasounds/Referrals: Normal, hypoplastic or absent nasal bone likely normal anatomic variant Fetal Ultrasounds or other Referrals:  None Maternal Substance Abuse:  No Significant Maternal Medications:  None Significant Maternal Lab Results: Lab values include: Group B Strep negative  No results found  for this or any previous visit (from the past 24 hour(s)).  Patient Active Problem List   Diagnosis Date Noted  . Maternal iron deficiency anemia affecting pregnancy in third trimester, antepartum 03/07/2017  . Supervision of other normal pregnancy, antepartum 11/11/2016  . Hidradenitis 11/11/2016    Assessment: Annette Benitez is a 31 y.o. [id number] at [redacted]w[redacted]d here for SOL.  #Labor: will allow to progress without augmentation and add pitocin as needed later #Pain: Epidural upon  request #FWB: Cat 1 #ID: GBS neg #MOF: bottle #MOC: Nuvaring #Circ:  Yes, inpatient  Lezlie Octave, MD Family Medicine Resident PGY-1  06/06/2017, 10:45 AM  CNM attestation:  I have seen and examined this patient; I agree with above documentation in the resident's note.   Annette Benitez is a 31 y.o. [phone] here for latent labor  PE: BP 111/73   Pulse (!) 105   Temp 98.2 F (36.8 C)   Resp 18   Ht  (1.702 m)   Wt 86.8 kg (191 lb 4.8 oz)   LMP 08/22/2016 (Exact Date)   SpO2 100%   BMI 29.96 kg/m   Resp: normal effort, no distress Abd: gravid  ROS, labs, PMH reviewed  Plan: Admit to [employer] Expectant management to start; Pit prn Anticipate SVD  Cam Hai CNM 06/06/2017, 3:38 PM

## 2017-06-06 NOTE — Anesthesia Pain Management Evaluation Note (Signed)
  CRNA Pain Management Visit Note  Patient: Annette Benitez, 31 y.o., female  "Hello I am a member of the anesthesia team at [facility]. We have an anesthesia team available at all times to provide care throughout the hospital, including epidural management and anesthesia for C-section. I don't know your plan for the delivery whether it a natural birth, water birth, IV sedation, nitrous supplementation, doula or epidural, but we want to meet your pain goals."   1.Was your pain managed to your expectations on prior hospitalizations?   Yes   2.What is your expectation for pain management during this hospitalization?     Epidural  3.How can we help you reach that goal? Epidural in place.  Record the patient's initial score and the patient's pain goal.   Pain: 0  Pain Goal: 9 prior to epidural placement.  The [facility] wants you to be able to say your pain was always managed very well.  Haadiya Frogge L 06/06/2017

## 2017-06-06 NOTE — Anesthesia Preprocedure Evaluation (Signed)
Anesthesia Evaluation  Patient identified by MRN, date of birth, ID band Patient awake    Reviewed: Allergy & Precautions, H&P , NPO status , Patient's Chart, lab work & pertinent test results, reviewed documented beta blocker date and time   Airway Mallampati: II  TM Distance: >3 FB Neck ROM: full    Dental no notable dental hx.    Pulmonary neg pulmonary ROS, former smoker,    Pulmonary exam normal breath sounds clear to auscultation       Cardiovascular negative cardio ROS Normal cardiovascular exam Rhythm:regular Rate:Normal     Neuro/Psych negative neurological ROS  negative psych ROS   GI/Hepatic negative GI ROS, Neg liver ROS,   Endo/Other  negative endocrine ROS  Renal/GU negative Renal ROS  negative genitourinary   Musculoskeletal negative musculoskeletal ROS (+)   Abdominal   Peds negative pediatric ROS (+)  Hematology negative hematology ROS (+) anemia ,   Anesthesia Other Findings   Reproductive/Obstetrics negative OB ROS (+) Pregnancy                             Anesthesia Physical Anesthesia Plan  ASA: II  Anesthesia Plan: Epidural   Post-op Pain Management:    Induction:   PONV Risk Score and Plan:   Airway Management Planned:   Additional Equipment:   Intra-op Plan:   Post-operative Plan:   Informed Consent: I have reviewed the patients History and Physical, chart, labs and discussed the procedure including the risks, benefits and alternatives for the proposed anesthesia with the patient or authorized representative who has indicated his/her understanding and acceptance.     Plan Discussed with:   Anesthesia Plan Comments:         Anesthesia Quick Evaluation

## 2017-06-06 NOTE — Progress Notes (Signed)
Labor Progress Note  S: Patient seen & examined for progress of labor. Patient comfortable with epidural.   O: BP 118/80   Pulse (!) 111   Temp 98.2 F (36.8 C)   Resp 18   Ht  (1.702 m)   Wt 86.8 kg (191 lb 4.8 oz)   LMP 08/22/2016 (Exact Date)   SpO2 100%   BMI 29.96 kg/m   FHT: 120bpm, mod var, +accels, no decels TOCO: q2-49min, patient looks comfortable during contractions  CVE: Dilation: 4.5 Effacement (%): 80 Station: -2 Presentation: Vertex Exam by:: m wilkins rnc  A&P: 31 y.o. [id number] [redacted]w[redacted]d here for SOL.  Currently not on augmentation; will likely add pitocin later if no change at next cervical check Continue expectant management for now Anticipate SVD  Lezlie Octave, MD [facility] Resident PGY-1 06/06/2017 2:46 PM

## 2017-06-06 NOTE — MAU Note (Signed)
+  contractions 7-10 minutes apart  Denies LOF or VB  +FM  VE last Thursday 1.5cm per patient

## 2017-06-06 NOTE — Anesthesia Procedure Notes (Signed)
Epidural Patient location during procedure: OB Start time: 06/06/2017 11:10 AM End time: 06/06/2017 11:28 AM  Staffing Anesthesiologist: Trinadee Verhagen  Preanesthetic Checklist Completed: patient identified, site marked, surgical consent, pre-op evaluation, timeout performed, IV checked, risks and benefits discussed and monitors and equipment checked  Epidural Patient position: sitting Prep: site prepped and draped and DuraPrep Patient monitoring: continuous pulse ox and blood pressure Approach: midline Location: L4-L5 Injection technique: LOR air  Needle:  Needle type: Tuohy  Needle gauge: 17 G Needle length: 9 cm and 9 Needle insertion depth: 5 cm cm Catheter type: closed end flexible Catheter size: 19 Gauge Catheter at skin depth: 10 cm Test dose: negative  Assessment Events: blood not aspirated, injection not painful, no injection resistance, negative IV test and no paresthesia

## 2017-06-06 NOTE — Progress Notes (Signed)
Dr Frances Furbish notified of FHR tracing, states she will review tracing

## 2017-06-07 ENCOUNTER — Inpatient Hospital Stay (HOSPITAL_COMMUNITY): Admission: RE | Admit: 2017-06-07 | Payer: Federal, State, Local not specified - PPO | Source: Ambulatory Visit

## 2017-06-07 ENCOUNTER — Encounter (HOSPITAL_COMMUNITY): Payer: Federal, State, Local not specified - PPO

## 2017-06-07 LAB — RPR: RPR Ser Ql: NONREACTIVE

## 2017-06-07 NOTE — Plan of Care (Signed)
Problem: Pain Management: Goal: General experience of comfort will improve and pain level will decrease Outcome: Completed/Met Date Met: 06/07/17 Pain education completed, discussed with mom that since this is not first child she will cramp more, explained this is normal.  Encouraged ambulation, and frequent voiding to keep bladder from being overly full.  Discussed pain mainagement

## 2017-06-07 NOTE — Progress Notes (Signed)
Post Partum Day 1  Subjective:  Annette Benitez is a 31 y.o. [id number] [redacted]w[redacted]d s/p SVD.  No acute events overnight.  Pt denies problems with ambulating, voiding or po intake.  She denies nausea or vomiting.  Pain is moderately controlled.  She has had flatus. She has not had bowel movement.  Lochia Moderate.  Plan for birth control is NuvaRing vaginal inserts.  Method of Feeding: bottle feeding.  Objective: BP 119/64 (BP Location: Right Arm)   Pulse 81   Temp 98 F (36.7 C) (Oral)   Resp 18   Ht  (1.702 m)   Wt 86.8 kg (191 lb 4.8 oz)   LMP 08/22/2016 (Exact Date)   SpO2 100%   Breastfeeding? Unknown   BMI 29.96 kg/m   Physical Exam:  General: alert, cooperative and no distress Lochia:normal flow Abdomen: +BS, soft, nontender, fundus firm  DVT Evaluation: No evidence of DVT seen on physical exam.   Recent Labs  06/06/17 1050  HGB 10.7*  HCT 32.4*    Assessment/Plan:  ASSESSMENT: Annette Benitez is a 31 y.o. [id number] [redacted]w[redacted]d ppd #1 s/p NSVD doing well.   Plan for discharge tomorrow and Circumcision prior to discharge   LOS: 1 day   Dory Larsen 06/07/2017, 7:26 AM

## 2017-06-07 NOTE — Anesthesia Postprocedure Evaluation (Signed)
Anesthesia Post Note  Patient: Annette Benitez  Procedure(s) Performed: * No procedures listed *     Patient location during evaluation: Mother Baby Anesthesia Type: Epidural Level of consciousness: awake, awake and alert, oriented and patient cooperative Pain management: pain level controlled Vital Signs Assessment: post-procedure vital signs reviewed and stable Respiratory status: spontaneous breathing, nonlabored ventilation and respiratory function stable Cardiovascular status: stable Postop Assessment: no headache, no backache, no apparent nausea or vomiting and patient able to bend at knees Anesthetic complications: no    Last Vitals:  Vitals:   06/06/17 2200 06/07/17 0201  BP: 121/74 119/64  Pulse: 88 81  Resp: 18 18  Temp: 37.1 C 36.7 C  SpO2: 100% 100%    Last Pain:  Vitals:   06/07/17 0615  TempSrc:   PainSc: 2    Pain Goal:                 Kendrew Paci L

## 2017-06-07 NOTE — Lactation Note (Signed)
This note was copied from a baby's chart. Lactation Consultation Note  Patient Name: Boy Ellawyn Wogan [id number] Date: 06/07/2017 Reason for consult: Initial assessment   Initial consult with mom of 18 hour old infant. Infant with 3 formula feeds of 3-15 cc, 2 BF attempts, 3 voids and 3 stools (MSF at birth also). Infant weight 7 lb 14.1 oz with 1% weight loss.   Mom reports she is planning to formula feed. She reports she tried BF as infant would not eat from a bottle. Infant is noted to have nasal congestion and mom is using Dr. Theora Gianotti bottle when [facility] entered room, infant was not suckling.   Mom asked several questions about breast milk and milk coming to volume, all questions answered. Mom was given a manual pump and was shown how to use it, assemble it and clean it. BF Resources handout and LC Brochure given, mom informed of IP/OP services, BF Support Groups and LC Phone #. Enc mom to call with any questions/concerns prn. To follow up with mom if she wants assistance, she is aware to call out if she would like BF assistance.    Maternal Data Formula Feeding for Exclusion: Yes Reason for exclusion: Mother's choice to formula feed on admision Has patient been taught Hand Expression?: No Does the patient have breastfeeding experience prior to this delivery?: Yes  Feeding Feeding Type: Formula Nipple Type:  (dr. Irving Burton)  LATCH Score                   Interventions Interventions: Expressed milk  Lactation Tools Discussed/Used WIC Program: No Pump Review: Setup, frequency, and cleaning;Milk Storage Initiated by:: Reviewed at mom's request   Consult Status Consult Status: Complete Follow-up type: Call as needed    Ed Blalock 06/07/2017, 1:37 PM

## 2017-06-08 MED ORDER — SUCROSE 24% NICU/PEDS ORAL SOLUTION: 0.5000 mL | OROMUCOSAL | Status: DC | PRN

## 2017-06-08 MED ORDER — EPINEPHRINE TOPICAL FOR CIRCUMCISION 0.1 MG/ML: 1.0000 [drp] | TOPICAL | Status: DC | PRN

## 2017-06-08 MED ORDER — SENNOSIDES-DOCUSATE SODIUM 8.6-50 MG PO TABS: 2.0000 | ORAL_TABLET | Freq: Every evening | ORAL | 0 refills | Status: DC | PRN

## 2017-06-08 MED ORDER — IBUPROFEN 600 MG PO TABS: 600.0000 mg | ORAL_TABLET | Freq: Four times a day (QID) | ORAL | 0 refills | Status: DC

## 2017-06-08 MED ORDER — ACETAMINOPHEN FOR CIRCUMCISION 160 MG/5 ML: 40.0000 mg | ORAL | Status: DC | PRN

## 2017-06-08 MED ORDER — LIDOCAINE 1% INJECTION FOR CIRCUMCISION
0.8000 mL | INJECTION | Freq: Once | INTRAVENOUS | Status: DC
  Filled 2017-06-08: qty 1

## 2017-06-08 MED ORDER — ACETAMINOPHEN FOR CIRCUMCISION 160 MG/5 ML: 40.0000 mg | Freq: Once | ORAL | Status: DC

## 2017-06-08 NOTE — Progress Notes (Signed)
Patient desires circumcision for her female infant.  Circumcision procedure details discussed, risks and benefits of procedure were also discussed.  These include but are not limited to: Benefits of circumcision in men include reduction in the rates of urinary tract infection (UTI), penile cancer, some sexually transmitted infections, penile inflammatory and retractile disorders, as well as easier hygiene.  Risks include bleeding , infection, injury of glans which may lead to penile deformity or urinary tract issues, unsatisfactory cosmetic appearance and other potential complications related to the procedure.  It was emphasized that this is an elective procedure.  Patient wants to proceed with circumcision; written informed consent obtained.  Will do circumcision soon, routine circumcision and post circumcision care ordered for the infant.  Rosie Torrez P. Griffin Dewilde, MD OB Fellow   

## 2017-06-08 NOTE — Discharge Instructions (Signed)

## 2017-06-08 NOTE — Progress Notes (Signed)
Post Partum Day 2 Subjective: Patient doing well this morning with no complaints. Abdominal pain well controlled with ibuprofen, up ab lib, voiding ok, minimal vaginal discharge. Patient plans on bottle feeding and using nuvaring. Patient ready to go home today  Objective: Blood pressure 123/83, pulse 80, temperature 98.2 F (36.8 C), temperature source Oral, resp. rate 18, height  (1.702 m), weight 86.8 kg (191 lb 4.8 oz), last menstrual period 08/22/2016, SpO2 100 %, unknown if currently breastfeeding.  Physical Exam:  General: alert, cooperative, appears stated age and no distress Lochia: appropriate Uterine Fundus: soft Incision: N/A Abdomen: soft, nontender to palpation  DVT Evaluation: No evidence of DVT seen on physical exam. Negative Homan's sign. No cords or calf tenderness.   Recent Labs  06/06/17 1050  HGB 10.7*  HCT 32.4*    Assessment/Plan: Discharge home Nuvaring for contraception   LOS: 2 days   Dyke Maes Aloni Chuang 06/08/2017, 7:35 AM

## 2017-06-08 NOTE — Discharge Summary (Signed)
OB Discharge Summary     Patient Name: Annette Benitez DOB: 03-Jun-1986 MRN: [id number]  Date of admission: 06/06/2017 Delivering MD: Lezlie Octave C   Date of discharge: 06/08/2017  Admitting diagnosis: 41WKS,LABOR Intrauterine pregnancy: [redacted]w[redacted]d     Secondary diagnosis:  Active Problems:   SVD (spontaneous vaginal delivery)  Additional problems: iron deficiency anemia, Hidradenitis suppurativa     Discharge diagnosis: Term Pregnancy Delivered                                                                                                Post partum procedures:none  Augmentation: augmentation with pitocin, AROM  Complications: None  Hospital course:  Onset of Labor With Vaginal Delivery     31 y.o. yo [id number] at [redacted]w[redacted]d was admitted in Active Labor on 06/06/2017. Patient had an uncomplicated labor course as follows:  Membrane Rupture Time/Date: 3:20 PM ,06/06/2017   Intrapartum Procedures: Episiotomy: None [1]                                         Lacerations:  None [1]  Patient had a delivery of a Viable infant. 06/06/2017  Information for the patient's newborn:  Annette, Benitez [[id number]]  Delivery Method: Vaginal, Spontaneous Delivery (Filed from Delivery Summary)    Pateint had an uncomplicated postpartum course.  She is ambulating, tolerating a regular diet, passing flatus, and urinating well. Patient is discharged home in stable condition on 06/08/17.   Physical exam  Vitals:   06/06/17 2200 06/07/17 0201 06/07/17 1758 06/08/17 0611  BP: 121/74 119/64 115/72 123/83  Pulse: 88 81 83 80  Resp: Temp: 98.7 F (37.1 C) 98 F (36.7 C) 98.7 F (37.1 C) 98.2 F (36.8 C)  TempSrc: Oral Oral Oral Oral  SpO2: 100% 100%    Weight:      Height:       General: alert, cooperative and no distress Lochia: appropriate Uterine Fundus: soft Incision: N/A DVT Evaluation: No evidence of DVT seen on physical exam. Negative Homan's sign. No cords or calf  tenderness. Labs: Lab Results  Component Value Date   WBC 12.7 (H) 06/06/2017   HGB 10.7 (L) 06/06/2017   HCT 32.4 (L) 06/06/2017   MCV 88.0 06/06/2017   PLT 240 06/06/2017   No flowsheet data found.  Discharge instruction: per After Visit Summary and "Baby and Me Booklet".  After visit meds:  Allergies as of 06/08/2017   No Known Allergies     Medication List    STOP taking these medications   clindamycin 1 % gel Commonly known as:  CLINDAGEL     TAKE these medications   CITRANATAL BLOOM 90-1 MG Tabs Take 1 tablet by mouth daily.   FERRALET 90 90-1 MG Tabs TK 1 T PO D   HUMIRA 40 MG/0.8ML Pskt Generic drug:  Adalimumab Inject into the skin.   ibuprofen 600 MG tablet Commonly known as:  ADVIL,MOTRIN Take 1 tablet (600 mg total) by mouth  every 6 (six) hours.   senna-docusate 8.6-50 MG tablet Commonly known as:  Senokot-S Take 2 tablets by mouth at bedtime as needed for mild constipation.            Discharge Care Instructions        Start     Ordered   06/08/17 0000  senna-docusate (SENOKOT-S) 8.6-50 MG tablet  At bedtime PRN     06/08/17 0833   06/08/17 0000  ibuprofen (ADVIL,MOTRIN) 600 MG tablet  Every 6 hours     06/08/17 0833      Diet: routine diet  Activity: Advance as tolerated. Pelvic rest for 6 weeks.   Outpatient follow up:4 weeks  Follow up Appt:Future Appointments Date Time Provider Department Center  07/05/2017 10:30 AM Orvilla Cornwall A, CNM [facility] None   Follow up Visit:No Follow-up on file.  Postpartum contraception: nuvaring  Newborn Data: Live born female  Birth Weight: 7 lb 15 oz (3600 g) APGAR: 8, 9  Baby Feeding: Bottle Disposition:home with mother   06/08/2017 Annette Benitez, Student-PA  OB FELLOW DISCHARGE ATTESTATION  I have seen and examined this patient. I agree with above documentation and have made edits as needed.   Caryl Ada, DO OB Fellow

## 2017-07-05 ENCOUNTER — Encounter: Payer: Self-pay | Admitting: Certified Nurse Midwife

## 2017-07-05 ENCOUNTER — Ambulatory Visit (INDEPENDENT_AMBULATORY_CARE_PROVIDER_SITE_OTHER): Payer: Federal, State, Local not specified - PPO | Admitting: Certified Nurse Midwife

## 2017-07-05 DIAGNOSIS — Z1389 Encounter for screening for other disorder: Secondary | ICD-10-CM | POA: Diagnosis not present

## 2017-07-05 DIAGNOSIS — Z30015 Encounter for initial prescription of vaginal ring hormonal contraceptive: Secondary | ICD-10-CM

## 2017-07-05 MED ORDER — ETONOGESTREL-ETHINYL ESTRADIOL 0.12-0.015 MG/24HR VA RING: VAGINAL_RING | VAGINAL | 4 refills | Status: DC

## 2017-07-05 NOTE — Progress Notes (Signed)
..  Post Partum Exam  Annette Benitez is a 31 y.o. [phone] female who presents for a postpartum visit. She is 4 weeks postpartum following a spontaneous vaginal delivery. I have fully reviewed the prenatal and intrapartum course. The delivery was at 41.1 gestational weeks.  Anesthesia: epidural. Postpartum course has been good. Baby's course has been good. Baby is feeding by bottle - Enfamil Prosobee. Bleeding staining only. Bowel function is normal. Bladder function is normal. Patient is not sexually active. Contraception method is none. Postpartum depression screening:neg  The following portions of the patient's history were reviewed and updated as appropriate: allergies, current medications, past family history, past medical history, past social history, past surgical history and problem list.  Review of Systems Pertinent items noted in HPI and remainder of comprehensive ROS otherwise negative.    Objective:  Not currently breastfeeding.  General:  alert, cooperative and no distress   Breasts:  inspection negative, no nipple discharge or bleeding, no masses or nodularity palpable  Lungs: clear to auscultation bilaterally  Heart:  regular rate and rhythm, S1, S2 normal, no murmur, click, rub or gallop  Abdomen: soft, non-tender; bowel sounds normal; no masses,  no organomegaly  Rectal Exam: Not performed.       Pap Smear: 11/11/16: negative Assessment:    Normal 4 week postpartum exam. Pap smear not done at today's visit.   Contraception management: start Nuva Ring in 2 weeks.   Plan:   1. Contraception: abstinence 2. Rx Nuva Ring 3. Follow up in: 5 months for annual exam or as needed.

## 2017-07-05 NOTE — Progress Notes (Signed)
[facility]

## 2018-02-24 ENCOUNTER — Other Ambulatory Visit: Payer: Self-pay | Admitting: Certified Nurse Midwife

## 2018-02-24 DIAGNOSIS — Z30015 Encounter for initial prescription of vaginal ring hormonal contraceptive: Secondary | ICD-10-CM

## 2019-04-18 DIAGNOSIS — L732 Hidradenitis suppurativa: Secondary | ICD-10-CM | POA: Diagnosis not present

## 2019-05-30 DIAGNOSIS — H5213 Myopia, bilateral: Secondary | ICD-10-CM | POA: Diagnosis not present

## 2019-05-30 DIAGNOSIS — H35412 Lattice degeneration of retina, left eye: Secondary | ICD-10-CM | POA: Diagnosis not present

## 2020-01-01 DIAGNOSIS — Z20822 Contact with and (suspected) exposure to covid-19: Secondary | ICD-10-CM | POA: Diagnosis not present

## 2020-01-14 DIAGNOSIS — L080 Pyoderma: Secondary | ICD-10-CM | POA: Diagnosis not present

## 2020-01-14 DIAGNOSIS — L738 Other specified follicular disorders: Secondary | ICD-10-CM | POA: Diagnosis not present

## 2020-01-14 DIAGNOSIS — L732 Hidradenitis suppurativa: Secondary | ICD-10-CM | POA: Diagnosis not present

## 2020-01-14 DIAGNOSIS — Z79899 Other long term (current) drug therapy: Secondary | ICD-10-CM | POA: Diagnosis not present

## 2020-05-07 DIAGNOSIS — H5213 Myopia, bilateral: Secondary | ICD-10-CM | POA: Diagnosis not present

## 2020-05-07 DIAGNOSIS — H35412 Lattice degeneration of retina, left eye: Secondary | ICD-10-CM | POA: Diagnosis not present

## 2020-07-16 DIAGNOSIS — L732 Hidradenitis suppurativa: Secondary | ICD-10-CM | POA: Diagnosis not present

## 2020-10-13 ENCOUNTER — Ambulatory Visit (INDEPENDENT_AMBULATORY_CARE_PROVIDER_SITE_OTHER): Payer: Federal, State, Local not specified - PPO

## 2020-10-13 ENCOUNTER — Encounter (HOSPITAL_COMMUNITY): Payer: Self-pay | Admitting: Urgent Care

## 2020-10-13 ENCOUNTER — Ambulatory Visit (HOSPITAL_COMMUNITY)
Admission: EM | Admit: 2020-10-13 | Discharge: 2020-10-13 | Disposition: A | Discharge status: Home / Self Care | Payer: Federal, State, Local not specified - PPO | Attending: Internal Medicine | Admitting: Internal Medicine

## 2020-10-13 ENCOUNTER — Other Ambulatory Visit: Payer: Self-pay

## 2020-10-13 DIAGNOSIS — R918 Other nonspecific abnormal finding of lung field: Secondary | ICD-10-CM | POA: Diagnosis not present

## 2020-10-13 DIAGNOSIS — R0789 Other chest pain: Secondary | ICD-10-CM | POA: Diagnosis not present

## 2020-10-13 DIAGNOSIS — R079 Chest pain, unspecified: Secondary | ICD-10-CM

## 2020-10-13 DIAGNOSIS — M546 Pain in thoracic spine: Secondary | ICD-10-CM

## 2020-10-13 MED ORDER — NAPROXEN 500 MG PO TABS: 500.0000 mg | ORAL_TABLET | Freq: Two times a day (BID) | ORAL | 0 refills | Status: DC

## 2020-10-13 MED ORDER — TIZANIDINE HCL 4 MG PO TABS: 4.0000 mg | ORAL_TABLET | Freq: Three times a day (TID) | ORAL | 0 refills | Status: DC | PRN

## 2020-10-13 NOTE — ED Provider Notes (Signed)
[facility]   MRN: [id number] DOB: [birth year]  Subjective:   Annette Benitez is a 35 y.o. female presenting for 4 day history of upper back pain, then went to the right side. In the past 2 days, developed right sided chest pain that is more constant, moderate-severe. Has used heating pad, Aleve. Denies fever, cough, shob, falls, trauma. Has not done heavy lifting. Started trying to lose weight, is doing 30 minute work outs. She is not a smoker. Has a history of hidradenitis.   No current facility-administered medications for this encounter.  Current Outpatient Medications:  .  Adalimumab 40 MG/0.8ML PSKT, Inject into the skin., Disp: , Rfl:  .  etonogestrel-ethinyl estradiol (NUVARING) 0.12-0.015 MG/24HR vaginal ring, Insert vaginally and leave in place for 4 consecutive weeks, then remove and replace., Disp: 3 each, Rfl: 4 .  Fe Cbn-Fe Gluc-FA-B12-C-DSS (FERRALET 90) 90-1 MG TABS, TK 1 T PO D, Disp: , Rfl: 5 .  Prenatal-DSS-FeCb-FeGl-FA (CITRANATAL BLOOM) 90-1 MG TABS, Take 1 tablet by mouth daily., Disp: 30 tablet, Rfl: 12   No Known Allergies  Past Medical History:  Diagnosis Date  . Hidradenitis      Past Surgical History:  Procedure Laterality Date  . NO PAST SURGERIES      Family History  Problem Relation Age of Onset  . Hypertension Father     Social History   Tobacco Use  . Smoking status: Former Games developer  . Smokeless tobacco: Never Used  Substance Use Topics  . Alcohol use: No    Comment: occasionally  . Drug use: No    ROS   Objective:   Vitals: BP 124/83 (BP Location: Right Arm)   Pulse 87   Temp 98.4 F (36.9 C) (Oral)   Resp 18   LMP 10/13/2020   SpO2 100%   Physical Exam Constitutional:      General: She is not in acute distress.    Appearance: Normal appearance. She is well-developed. She is not ill-appearing, toxic-appearing or diaphoretic.  HENT:     Head: Normocephalic and atraumatic.     Nose: Nose normal.      Mouth/Throat:     Mouth: Mucous membranes are moist.  Eyes:     Extraocular Movements: Extraocular movements intact.     Pupils: Pupils are equal, round, and reactive to light.  Cardiovascular:     Rate and Rhythm: Normal rate and regular rhythm.     Pulses: Normal pulses.     Heart sounds: Normal heart sounds. No murmur heard. No friction rub. No gallop.   Pulmonary:     Effort: Pulmonary effort is normal. No respiratory distress.     Breath sounds: Normal breath sounds. No stridor. No wheezing, rhonchi or rales.  Skin:    General: Skin is warm and dry.     Findings: No rash.  Neurological:     Mental Status: She is alert and oriented to person, place, and time.  Psychiatric:        Mood and Affect: Mood normal.        Behavior: Behavior normal.        Thought Content: Thought content normal.     ED ECG REPORT   Date: 10/13/2020  Rate: 71bpm  Rhythm: normal sinus rhythm  QRS Axis: normal  Intervals: normal  ST/T Wave abnormalities: nonspecific T wave changes  Conduction Disutrbances:none  Narrative Interpretation: Sinus rhythm at 71bpm, no previous ecg available for comparison.   Old EKG Reviewed: none  available  I have personally reviewed the EKG tracing and agree with the computerized printout as noted.  DG Chest 2 View  Result Date: 10/13/2020 CLINICAL DATA:  Possible pulmonary nodule. EXAM: CHEST - 2 VIEW COMPARISON:  Earlier film, same date. FINDINGS: The small nodular density seen on the prior chest x-ray corresponds to the right nipple which is marked with a nipple marker. The lungs are clear. No pulmonary lesions are identified. IMPRESSION: 1. No acute cardiopulmonary findings. 2. The small nodular density seen on the prior chest x-ray corresponds to the right nipple. Electronically Signed   By: Rudie Meyer M.D.   On: 10/13/2020 13:06   DG Chest 2 View  Result Date: 10/13/2020 CLINICAL DATA:  Right-sided chest pain EXAM: CHEST - 2 VIEW COMPARISON:  None.  FINDINGS: The heart size and mediastinal contours are within normal limits. 7 mm nodular density projecting at the peripheral aspect of the right lung base may represent nipple shadow. Subtle nodular airspace opacity at this location not excluded. Elsewhere, lungs are clear. No pleural effusion or pneumothorax. The visualized skeletal structures are unremarkable. IMPRESSION: 7 mm nodular density projecting at the peripheral aspect of the right lung base may represent nipple shadow. Subtle nodular airspace opacity at this location not excluded. Recommend repeat radiographs with nipple markers. Electronically Signed   By: Duanne Guess D.O.   On: 10/13/2020 12:36    Assessment and Plan :   PDMP not reviewed this encounter.  1. Atypical chest pain   2. Acute right-sided thoracic back pain   3. Right-sided chest pain     We will manage conservatively for musculoskeletal pain with naproxen, tizanidine and lighter exercise than she is currently doing. Counseled patient on potential for adverse effects with medications prescribed/recommended today, ER and return-to-clinic precautions discussed, patient verbalized understanding.    Wallis Bamberg, PA-C 10/13/20 1311

## 2020-10-13 NOTE — ED Triage Notes (Signed)
Pt reports upper back pain lateral and radiates to RT chest. Pt denies any injury.

## 2020-10-15 ENCOUNTER — Emergency Department (HOSPITAL_COMMUNITY)
Admission: EM | Admit: 2020-10-15 | Discharge: 2020-10-15 | Disposition: A | Discharge status: Home / Self Care | Payer: Federal, State, Local not specified - PPO | Attending: Emergency Medicine | Admitting: Emergency Medicine

## 2020-10-15 ENCOUNTER — Encounter (HOSPITAL_COMMUNITY): Payer: Self-pay | Admitting: Emergency Medicine

## 2020-10-15 DIAGNOSIS — Z87891 Personal history of nicotine dependence: Secondary | ICD-10-CM | POA: Insufficient documentation

## 2020-10-15 DIAGNOSIS — B029 Zoster without complications: Secondary | ICD-10-CM | POA: Diagnosis not present

## 2020-10-15 DIAGNOSIS — R079 Chest pain, unspecified: Secondary | ICD-10-CM | POA: Diagnosis not present

## 2020-10-15 DIAGNOSIS — R21 Rash and other nonspecific skin eruption: Secondary | ICD-10-CM | POA: Diagnosis not present

## 2020-10-15 MED ORDER — OXYCODONE-ACETAMINOPHEN 5-325 MG PO TABS: 1.0000 | ORAL_TABLET | Freq: Four times a day (QID) | ORAL | 0 refills | Status: DC | PRN

## 2020-10-15 MED ORDER — VALACYCLOVIR HCL 1 G PO TABS: 1000.0000 mg | ORAL_TABLET | Freq: Three times a day (TID) | ORAL | 0 refills | Status: AC

## 2020-10-15 MED ORDER — VALACYCLOVIR HCL 500 MG PO TABS
1000.0000 mg | ORAL_TABLET | Freq: Once | ORAL | Status: AC
  Administered 2020-10-15: 1000 mg via ORAL
  Filled 2020-10-15: qty 2

## 2020-10-15 MED ORDER — OXYCODONE-ACETAMINOPHEN 5-325 MG PO TABS
1.0000 | ORAL_TABLET | Freq: Once | ORAL | Status: AC
  Administered 2020-10-15: 1 via ORAL
  Filled 2020-10-15: qty 1

## 2020-10-15 NOTE — ED Provider Notes (Signed)
[facility] EMERGENCY DEPARTMENT Provider Note   CSN: [id number] Arrival date & time: 10/15/20  5462     History Chief Complaint  Patient presents with  . Back Pain    Annette Benitez is a 35 y.o. female with PMHx hidradenitis, presenting to the ED with complaint of painful rash to right back and chest that began yesterday. She states pain initially began on 10/09/2020, was evaluated 2 days ago at [facility] with neg CXR. She states rash didn't start until after her UC visit. Pain is worsening. UC prescribed naproxen and zanaflex without relief. She does endorse hx of chickenpox as a child. No fever.   The history is provided by the patient.       Past Medical History:  Diagnosis Date  . Hidradenitis     Patient Active Problem List   Diagnosis Date Noted  . Hidradenitis 11/11/2016    Past Surgical History:  Procedure Laterality Date  . NO PAST SURGERIES       OB History    Gravida  5   Para  3   Term  3   Preterm      AB  2   Living  3     SAB      IAB  2   Ectopic      Multiple  0   Live Births  3           Family History  Problem Relation Age of Onset  . Hypertension Father     Social History   Tobacco Use  . Smoking status: Former Games developer  . Smokeless tobacco: Never Used  Substance Use Topics  . Alcohol use: No    Comment: occasionally  . Drug use: No    Home Medications Prior to Admission medications   Medication Sig Start Date End Date Taking? Authorizing Provider  oxyCODONE-acetaminophen (PERCOCET/ROXICET) 5-325 MG tablet Take 1-2 tablets by mouth every 6 (six) hours as needed for severe pain. 10/15/20  Yes Robinson, [place] N, PA-C  valACYclovir (VALTREX) 1000 MG tablet Take 1 tablet (1,000 mg total) by mouth 3 (three) times daily for 7 days. 10/15/20 10/22/20 Yes Roxan Hockey, [place] N, PA-C  Adalimumab 40 MG/0.8ML PSKT Inject into the skin.    [provider]  etonogestrel-ethinyl estradiol (NUVARING) 0.12-0.015  MG/24HR vaginal ring Insert vaginally and leave in place for 4 consecutive weeks, then remove and replace. 07/05/17   Denney, Rachelle A, CNM  Fe Cbn-Fe Gluc-FA-B12-C-DSS (FERRALET 90) 90-1 MG TABS TK 1 T PO D 05/30/17   [provider]  naproxen (NAPROSYN) 500 MG tablet Take 1 tablet (500 mg total) by mouth 2 (two) times daily with a meal. 10/13/20   Wallis Bamberg, PA-C  Prenatal-DSS-FeCb-FeGl-FA (CITRANATAL BLOOM) 90-1 MG TABS Take 1 tablet by mouth daily. 03/07/17   Orvilla Cornwall A, CNM  tiZANidine (ZANAFLEX) 4 MG tablet Take 1 tablet (4 mg total) by mouth every 8 (eight) hours as needed. 10/13/20   Wallis Bamberg, PA-C    Allergies    Patient has no known allergies.  Review of Systems   Review of Systems  Constitutional: Negative for fever.  Musculoskeletal: Positive for myalgias.  Skin: Positive for rash.  All other systems reviewed and are negative.   Physical Exam Updated Vital Signs BP 129/84 (BP Location: Left Arm)   Pulse 93   Temp 98.8 F (37.1 C)   Resp 18   Ht 5\' 7"  (1.702 m)   Wt 86.2 kg  LMP 10/13/2020   SpO2 100%   BMI 29.76 kg/m   Physical Exam Vitals and nursing note reviewed.  Constitutional:      Appearance: She is well-developed.  HENT:     Head: Normocephalic and atraumatic.     Comments: No rash to the face. Eyes:     Conjunctiva/sclera: Conjunctivae normal.  Cardiovascular:     Rate and Rhythm: Normal rate and regular rhythm.  Pulmonary:     Effort: Pulmonary effort is normal. No respiratory distress.     Breath sounds: Normal breath sounds.  Skin:    Comments: There is erythematous rash with grouped clear fluid-filled blistering traveling in a dermatomal pattern just below the scapula across lower right breast. Few small blisters noted to areola. Rash does not cross the midline. It is very tender. No purulence.  Neurological:     Mental Status: She is alert.  Psychiatric:        Mood and Affect: Mood normal.        Behavior: Behavior  normal.     ED Results / Procedures / Treatments   Labs (all labs ordered are listed, but only abnormal results are displayed) Labs Reviewed - No data to display  EKG EKG Interpretation  Date/Time:  Thursday October 15 2020 09:22:29 EST Ventricular Rate:  100 PR Interval:  126 QRS Duration: 76 QT Interval:  354 QTC Calculation: 456 R Axis:   19 Text Interpretation: Normal sinus rhythm Normal ECG No significant change since last tracing Confirmed by Melene Plan [phone]) on 10/15/2020 9:48:50 AM   Radiology DG Chest 2 View  Result Date: 10/13/2020 CLINICAL DATA:  Possible pulmonary nodule. EXAM: CHEST - 2 VIEW COMPARISON:  Earlier film, same date. FINDINGS: The small nodular density seen on the prior chest x-ray corresponds to the right nipple which is marked with a nipple marker. The lungs are clear. No pulmonary lesions are identified. IMPRESSION: 1. No acute cardiopulmonary findings. 2. The small nodular density seen on the prior chest x-ray corresponds to the right nipple. Electronically Signed   By: Rudie Meyer M.D.   On: 10/13/2020 13:06   DG Chest 2 View  Result Date: 10/13/2020 CLINICAL DATA:  Right-sided chest pain EXAM: CHEST - 2 VIEW COMPARISON:  None. FINDINGS: The heart size and mediastinal contours are within normal limits. 7 mm nodular density projecting at the peripheral aspect of the right lung base may represent nipple shadow. Subtle nodular airspace opacity at this location not excluded. Elsewhere, lungs are clear. No pleural effusion or pneumothorax. The visualized skeletal structures are unremarkable. IMPRESSION: 7 mm nodular density projecting at the peripheral aspect of the right lung base may represent nipple shadow. Subtle nodular airspace opacity at this location not excluded. Recommend repeat radiographs with nipple markers. Electronically Signed   By: Duanne Guess D.O.   On: 10/13/2020 12:36    Procedures Procedures   Medications Ordered in  ED Medications  valACYclovir (VALTREX) tablet 1,000 mg (1,000 mg Oral Given 10/15/20 1123)  oxyCODONE-acetaminophen (PERCOCET/ROXICET) 5-325 MG per tablet 1 tablet (1 tablet Oral Given 10/15/20 1123)    ED Course  I have reviewed the triage vital signs and the nursing notes.  Pertinent labs & imaging results that were available during my care of the patient were reviewed by me and considered in my medical decision making (see chart for details).    MDM Rules/Calculators/A&P  Patient with presentation consistent with herpes zoster. Rash is tender with grouped clear vesicles on an erythematous base located along dermatome  unilaterally on the right.  Pain treated in the department.  Pt without signs of CNS involvement and there is no involvement of the face or eyes; no concern for opthalmic zoster.  Will discharge home with pain management and valacyclovir.  Also recommend OTC NSAIDs and cool compresses as needed for additional pain control. Follow with urgent care, as she has not yet established PCP. Discussed contagiousness.   Discussed results, findings, treatment and follow up. Patient advised of return precautions. Patient verbalized understanding and agreed with plan.  [place] Controlled Substance reporting System queried  Final Clinical Impression(s) / ED Diagnoses Final diagnoses:  Herpes zoster without complication    Rx / DC Orders ED Discharge Orders         Ordered    valACYclovir (VALTREX) 1000 MG tablet  3 times daily        10/15/20 1134    oxyCODONE-acetaminophen (PERCOCET/ROXICET) 5-325 MG tablet  Every 6 hours PRN        10/15/20 1134           Robinson, [place] N, PA-C 10/15/20 1140    Melene Plan, DO 10/15/20 1149

## 2020-10-15 NOTE — ED Triage Notes (Signed)
Pt states she began having right sided chest and back pain on 1/28 she was seen at [facility] on 2/1 and had xrays done of her chest. She was prescribed naproxen & zanaflex and then she developed a rash under her right breast and follows in into right back in same dermatone. Rash is painful.

## 2020-10-15 NOTE — Discharge Instructions (Addendum)
Take the valacyclovir every 8 hours (3 times daily) until gone. You can take the oxycodone every 6 hours as needed for more severe pain.  Be aware there is 325 mg of Tylenol in each tab.  This medication can make you drowsy, do not drive or drink alcohol while taking. Shingles is a contagious virus.  Avoid contact with any immunocompromised, elderly, infants, or pregnant people, as they have potential to have more severe illness if contracted. You are considered contagious until you stop developing new rash. You can follow with urgent care while you establish PCP as needed. You can continue taking the naproxen every 12 hours for pain. Avoid scratching the rash to prevent any secondary bacterial infection. Return to the emergency department if the rash spreads to the rest of your body, you get high fevers, or other concerning symptoms.

## 2020-11-27 ENCOUNTER — Ambulatory Visit: Payer: Federal, State, Local not specified - PPO | Admitting: Nurse Practitioner

## 2020-11-27 ENCOUNTER — Other Ambulatory Visit: Payer: Self-pay

## 2020-11-27 ENCOUNTER — Encounter: Payer: Self-pay | Admitting: Nurse Practitioner

## 2020-11-27 VITALS — BP 122/80 | HR 102 | Temp 97.5°F | Ht 67.0 in | Wt 186.8 lb

## 2020-11-27 DIAGNOSIS — L732 Hidradenitis suppurativa: Secondary | ICD-10-CM | POA: Diagnosis not present

## 2020-11-27 DIAGNOSIS — Z8619 Personal history of other infectious and parasitic diseases: Secondary | ICD-10-CM

## 2020-11-27 DIAGNOSIS — D84821 Immunodeficiency due to drugs: Secondary | ICD-10-CM | POA: Insufficient documentation

## 2020-11-27 DIAGNOSIS — Z23 Encounter for immunization: Secondary | ICD-10-CM | POA: Diagnosis not present

## 2020-11-27 DIAGNOSIS — Z30015 Encounter for initial prescription of vaginal ring hormonal contraceptive: Secondary | ICD-10-CM | POA: Insufficient documentation

## 2020-11-27 DIAGNOSIS — Z79899 Other long term (current) drug therapy: Secondary | ICD-10-CM

## 2020-11-27 LAB — POCT URINE PREGNANCY: Preg Test, Ur: NEGATIVE

## 2020-11-27 MED ORDER — ETONOGESTREL-ETHINYL ESTRADIOL 0.12-0.015 MG/24HR VA RING: VAGINAL_RING | VAGINAL | 4 refills | Status: DC

## 2020-11-27 NOTE — Patient Instructions (Signed)
Thank you for choosing [facility]  Sign medical release to get records from previous GYN.  Zoster Vaccine, Recombinant injection What is this medicine? ZOSTER VACCINE (ZOS ter vak SEEN) is a vaccine used to reduce the risk of getting shingles. This vaccine is not used to treat shingles or nerve pain from shingles. This medicine may be used for other purposes; ask your health care provider or pharmacist if you have questions. COMMON BRAND NAME(S): [facility] What should I tell my health care provider before I take this medicine? They need to know if you have any of these conditions:  cancer  immune system problems  an unusual or allergic reaction to Zoster vaccine, other medications, foods, dyes, or preservatives  pregnant or trying to get pregnant  breast-feeding How should I use this medicine? This vaccine is injected into a muscle. It is given by a health care provider. A copy of Vaccine Information Statements will be given before each vaccination. Be sure to read this information carefully each time. This sheet may change often. Talk to your health care provider about the use of this vaccine in children. This vaccine is not approved for use in children. Overdosage: If you think you have taken too much of this medicine contact a poison control center or emergency room at once. NOTE: This medicine is only for you. Do not share this medicine with others. What if I miss a dose? Keep appointments for follow-up (booster) doses. It is important not to miss your dose. Call your health care provider if you are unable to keep an appointment. What may interact with this medicine?  medicines that suppress your immune system  medicines to treat cancer  steroid medicines like prednisone or cortisone This list may not describe all possible interactions. Give your health care provider a list of all the medicines, herbs, non-prescription drugs, or dietary supplements you use. Also tell  them if you smoke, drink alcohol, or use illegal drugs. Some items may interact with your medicine. What should I watch for while using this medicine? Visit your health care provider regularly. This vaccine, like all vaccines, may not fully protect everyone. What side effects may I notice from receiving this medicine? Side effects that you should report to your doctor or health care professional as soon as possible:  allergic reactions (skin rash, itching or hives; swelling of the face, lips, or tongue)  trouble breathing Side effects that usually do not require medical attention (report these to your doctor or health care professional if they continue or are bothersome):  chills  headache  fever  nausea  pain, redness, or irritation at site where injected  tiredness  vomiting This list may not describe all possible side effects. Call your doctor for medical advice about side effects. You may report side effects to FDA at 1-800-FDA-1088. Where should I keep my medicine? This vaccine is only given by a health care provider. It will not be stored at home. NOTE: This sheet is a summary. It may not cover all possible information. If you have questions about this medicine, talk to your doctor, pharmacist, or health care provider.  2021 Elsevier/Gold Standard (2019-10-04 16:23:07)

## 2020-11-27 NOTE — Assessment & Plan Note (Signed)
Managed by [facility] Current use of Humira

## 2020-11-27 NOTE — Progress Notes (Signed)
Subjective:  Patient ID: Annette Benitez, female    DOB: 1985-12-02  Age: 35 y.o. MRN: [id number]  CC: Establish Care (New patient/Pt had shingles around the end of Jan. 2022 and would like to know if she could get the vaccine now that the issues is resolved. )  HPI  Hidradenitis suppurativa Managed by [facility] Dermat[facility] Current use of Humira  Encounter for initial prescription of vaginal ring hormonal contraceptive She will like to resume use of nuvaring. Negative urine pregnancy today. No Hx of migraine or PVT/PE or HTN or tobacco use No FHx of hypercoagulation disorder.  Nuvaring refill sent  Hx of herpes zoster Hx of varicella as child 1st herpes outbreak on right upper chest and shoulder 63month ago. Her symptoms have completely resolved with no residual neuralgia. Current use of humira for hidradenitis supprativa. No other vaccines administered in last 106month. Negative urine pregnancy today  Reviewed possible vaccine side effects and need for 2doses 2-63month apart. She verbalized understanding. 1st shringrix vaccine dose given  Reviewed past Medical, Social and Family history today.  Outpatient Medications Prior to Visit  Medication Sig Dispense Refill  . Adalimumab 40 MG/0.8ML PSKT Inject into the skin.    . naproxen (NAPROSYN) 500 MG tablet Take 1 tablet (500 mg total) by mouth 2 (two) times daily with a meal. (Patient not taking: Reported on 11/27/2020) 30 tablet 0  . etonogestrel-ethinyl estradiol (NUVARING) 0.12-0.015 MG/24HR vaginal ring Insert vaginally and leave in place for 4 consecutive weeks, then remove and replace. (Patient not taking: Reported on 11/27/2020) 3 each 4  . Fe Cbn-Fe Gluc-FA-B12-C-DSS (FERRALET 90) 90-1 MG TABS TK 1 T PO D (Patient not taking: Reported on 11/27/2020)  5  . oxyCODONE-acetaminophen (PERCOCET/ROXICET) 5-325 MG tablet Take 1-2 tablets by mouth every 6 (six) hours as needed for severe pain. (Patient not taking: Reported on 11/27/2020) 15  tablet 0  . Prenatal-DSS-FeCb-FeGl-FA (CITRANATAL BLOOM) 90-1 MG TABS Take 1 tablet by mouth daily. (Patient not taking: Reported on 11/27/2020) 30 tablet 12  . tiZANidine (ZANAFLEX) 4 MG tablet Take 1 tablet (4 mg total) by mouth every 8 (eight) hours as needed. (Patient not taking: Reported on 11/27/2020) 30 tablet 0   No facility-administered medications prior to visit.    ROS See HPI  Objective:  BP 122/80 (BP Location: Left Arm, Patient Position: Sitting, Cuff Size: Large)   Pulse (!) 102   Temp (!) 97.5 F (36.4 C) (Temporal)   Ht 5\' 7"  (1.702 m)   Wt 186 lb 12.8 oz (84.7 kg)   LMP 11/10/2020   SpO2 99%   Breastfeeding No   BMI 29.26 kg/m   Physical Exam Cardiovascular:     Rate and Rhythm: Normal rate and regular rhythm.     Pulses: Normal pulses.     Heart sounds: Normal heart sounds.  Pulmonary:     Effort: Pulmonary effort is normal.     Breath sounds: Normal breath sounds.  Musculoskeletal:     Cervical back: Normal range of motion and neck supple.  Lymphadenopathy:     Cervical: No cervical adenopathy.  Skin:    General: Skin is warm and dry.     Findings: No erythema or rash.  Neurological:     Mental Status: She is alert and oriented to person, place, and time.    Assessment & Plan:  This visit occurred during the SARS-CoV-2 public health emergency.  Safety protocols were in place, including screening questions prior to the visit, additional usage of  staff PPE, and extensive cleaning of exam room while observing appropriate contact time as indicated for disinfecting solutions.   [name] was seen today for establish care.  Diagnoses and all orders for this visit:  Encounter for initial prescription of vaginal ring hormonal contraceptive -     POCT urine pregnancy -     etonogestrel-ethinyl estradiol (NUVARING) 0.12-0.015 MG/24HR vaginal ring; Insert vaginally and leave in place for 4 consecutive weeks, then remove and replace.  Immunocompromised state  due to drug therapy (HCC)  Hidradenitis suppurativa  Need for varicella vaccine -     Varicella-zoster vaccine IM  Hx of herpes zoster   Problem List Items Addressed This Visit      Musculoskeletal and Integument   Hidradenitis suppurativa    Managed by [facility] D[facility]at[facility] Current use of Humira        Other   Encounter for initial prescription of vaginal ring hormonal contraceptive - Primary    She will like to resume use of nuvaring. Negative urine pregnancy today. No Hx of migraine or PVT/PE or HTN or tobacco use No FHx of hypercoagulation disorder.  Nuvaring refill sent      Relevant Medications   etonogestrel-ethinyl estradiol (NUVARING) 0.12-0.015 MG/24HR vaginal ring   Other Relevant Orders   POCT urine pregnancy (Completed)   Hx of herpes zoster    Hx of varicella as child 1st herpes outbreak on right upper chest and shoulder 36month ago. Her symptoms have completely resolved with no residual neuralgia. Current use of humira for hidradenitis supprativa. No other vaccines administered in last 75month. Negative urine pregnancy today  Reviewed possible vaccine side effects and need for 2doses 2-27month apart. She verbalized understanding. 1st shringrix vaccine dose given      Immunocompromised state due to drug therapy [facility])    Other Visit Diagnoses    Need for varicella vaccine       Relevant Orders   Varicella-zoster vaccine IM (Completed)      Follow-up: Return in about 4 weeks (around 12/25/2020) for CPE (fasting).  Alysia Penna, NP

## 2020-12-03 NOTE — Assessment & Plan Note (Signed)
She will like to resume use of nuvaring. Negative urine pregnancy today. No Hx of migraine or PVT/PE or HTN or tobacco use No FHx of hypercoagulation disorder.  Nuvaring refill sent

## 2020-12-03 NOTE — Assessment & Plan Note (Addendum)
Hx of varicella as child 1st herpes outbreak on right upper chest and shoulder 17month ago. Her symptoms have completely resolved with no residual neuralgia. Current use of humira for hidradenitis supprativa. No other vaccines administered in last 40month. Negative urine pregnancy today  Reviewed possible vaccine side effects and need for 2doses 2-62month apart. She verbalized understanding. 1st shringrix vaccine dose given

## 2020-12-31 ENCOUNTER — Ambulatory Visit (INDEPENDENT_AMBULATORY_CARE_PROVIDER_SITE_OTHER): Payer: Federal, State, Local not specified - PPO | Admitting: Nurse Practitioner

## 2020-12-31 ENCOUNTER — Other Ambulatory Visit: Payer: Self-pay

## 2020-12-31 ENCOUNTER — Encounter: Payer: Self-pay | Admitting: Nurse Practitioner

## 2020-12-31 ENCOUNTER — Other Ambulatory Visit (HOSPITAL_COMMUNITY)
Admission: RE | Admit: 2020-12-31 | Discharge: 2020-12-31 | Disposition: A | Discharge status: Home / Self Care | Payer: Federal, State, Local not specified - PPO | Source: Ambulatory Visit | Attending: Nurse Practitioner | Admitting: Nurse Practitioner

## 2020-12-31 VITALS — BP 116/72 | HR 81 | Temp 97.7°F | Ht 67.75 in | Wt 188.6 lb

## 2020-12-31 DIAGNOSIS — Z13220 Encounter for screening for lipoid disorders: Secondary | ICD-10-CM

## 2020-12-31 DIAGNOSIS — L732 Hidradenitis suppurativa: Secondary | ICD-10-CM

## 2020-12-31 DIAGNOSIS — Z0001 Encounter for general adult medical examination with abnormal findings: Secondary | ICD-10-CM | POA: Diagnosis not present

## 2020-12-31 DIAGNOSIS — Z113 Encounter for screening for infections with a predominantly sexual mode of transmission: Secondary | ICD-10-CM

## 2020-12-31 DIAGNOSIS — Z124 Encounter for screening for malignant neoplasm of cervix: Secondary | ICD-10-CM | POA: Diagnosis not present

## 2020-12-31 DIAGNOSIS — Z136 Encounter for screening for cardiovascular disorders: Secondary | ICD-10-CM | POA: Diagnosis not present

## 2020-12-31 LAB — COMPREHENSIVE METABOLIC PANEL
ALT: 11 U/L (ref 0–35)
AST: 14 U/L (ref 0–37)
Albumin: 3.9 g/dL (ref 3.5–5.2)
Alkaline Phosphatase: 45 U/L (ref 39–117)
BUN: 8 mg/dL (ref 6–23)
CO2: 25 mEq/L (ref 19–32)
Calcium: 9.5 mg/dL (ref 8.4–10.5)
Chloride: 102 mEq/L (ref 96–112)
Creatinine, Ser: 0.64 mg/dL (ref 0.40–1.20)
GFR: 113.55 mL/min (ref >60.00)
Glucose, Bld: 78 mg/dL (ref 70–99)
Potassium: 4.1 mEq/L (ref 3.5–5.1)
Sodium: 137 mEq/L (ref 135–145)
Total Bilirubin: 0.2 mg/dL (ref 0.2–1.2)
Total Protein: 8.2 g/dL (ref 6.0–8.3)

## 2020-12-31 LAB — CBC WITH DIFFERENTIAL/PLATELET
Basophils Absolute: 0 10*3/uL (ref 0.0–0.1)
Basophils Relative: 0.4 % (ref 0.0–3.0)
Eosinophils Absolute: 0.2 10*3/uL (ref 0.0–0.7)
Eosinophils Relative: 1.7 % (ref 0.0–5.0)
HCT: 33.5 % — ABNORMAL LOW (ref 36.0–46.0)
Hemoglobin: 11.3 g/dL — ABNORMAL LOW (ref 12.0–15.0)
Lymphocytes Relative: 36.9 % (ref 12.0–46.0)
Lymphs Abs: 4 10*3/uL (ref 0.7–4.0)
MCHC: 33.6 g/dL (ref 30.0–36.0)
MCV: 90.7 fl (ref 78.0–100.0)
Monocytes Absolute: 0.9 10*3/uL (ref 0.1–1.0)
Monocytes Relative: 8 % (ref 3.0–12.0)
Neutro Abs: 5.8 10*3/uL (ref 1.4–7.7)
Neutrophils Relative %: 53 % (ref 43.0–77.0)
Platelets: 272 10*3/uL (ref 150.0–400.0)
RBC: 3.7 Mil/uL — ABNORMAL LOW (ref 3.87–5.11)
RDW: 14 % (ref 11.5–15.5)
WBC: 10.9 10*3/uL — ABNORMAL HIGH (ref 4.0–10.5)

## 2020-12-31 LAB — LIPID PANEL
Cholesterol: 168 mg/dL (ref 0–200)
HDL: 51.6 mg/dL (ref >39.00)
LDL Cholesterol: 82 mg/dL (ref 0–99)
NonHDL: 116.3
Total CHOL/HDL Ratio: 3
Triglycerides: 173 mg/dL — ABNORMAL HIGH (ref 0.0–149.0)
VLDL: 34.6 mg/dL (ref 0.0–40.0)

## 2020-12-31 LAB — TSH: TSH: 0.83 u[IU]/mL (ref 0.35–4.50)

## 2020-12-31 MED ORDER — SULFAMETHOXAZOLE-TRIMETHOPRIM 800-160 MG PO TABS: 1.0000 | ORAL_TABLET | Freq: Two times a day (BID) | ORAL | 0 refills | Status: DC

## 2020-12-31 NOTE — Patient Instructions (Addendum)
Hold humira injection till complete oral abx Use warm compress and sitz bath to help lesions drain.  Go to lab for blood draw.  Preventive Care 72-37 Years Old, Female Preventive care refers to lifestyle choices and visits with your health care provider that can promote health and wellness. This includes:  A yearly physical exam. This is also called an annual wellness visit.  Regular dental and eye exams.  Immunizations.  Screening for certain conditions.  Healthy lifestyle choices, such as: ? Eating a healthy diet. ? Getting regular exercise. ? Not using drugs or products that contain nicotine and tobacco. ? Limiting alcohol use. What can I expect for my preventive care visit? Physical exam Your health care provider may check your:  Height and weight. These may be used to calculate your BMI (body mass index). BMI is a measurement that tells if you are at a healthy weight.  Heart rate and blood pressure.  Body temperature.  Skin for abnormal spots. Counseling Your health care provider may ask you questions about your:  Past medical problems.  Family's medical history.  Alcohol, tobacco, and drug use.  Emotional well-being.  Home life and relationship well-being.  Sexual activity.  Diet, exercise, and sleep habits.  Work and work Statistician.  Access to firearms.  Method of birth control.  Menstrual cycle.  Pregnancy history. What immunizations do I need? Vaccines are usually given at various ages, according to a schedule. Your health care provider will recommend vaccines for you based on your age, medical history, and lifestyle or other factors, such as travel or where you work.   What tests do I need? Blood tests  Lipid and cholesterol levels. These may be checked every 5 years starting at age 44.  Hepatitis C test.  Hepatitis B test. Screening  Diabetes screening. This is done by checking your blood sugar (glucose) after you have not eaten for a  while (fasting).  STD (sexually transmitted disease) testing, if you are at risk.  BRCA-related cancer screening. This may be done if you have a family history of breast, ovarian, tubal, or peritoneal cancers.  Pelvic exam and Pap test. This may be done every 3 years starting at age 63. Starting at age 58, this may be done every 5 years if you have a Pap test in combination with an HPV test. Talk with your health care provider about your test results, treatment options, and if necessary, the need for more tests.   Follow these instructions at home: Eating and drinking  Eat a healthy diet that includes fresh fruits and vegetables, whole grains, lean protein, and low-fat dairy products.  Take vitamin and mineral supplements as recommended by your health care provider.  Do not drink alcohol if: ? Your health care provider tells you not to drink. ? You are pregnant, may be pregnant, or are planning to become pregnant.  If you drink alcohol: ? Limit how much you have to 0-1 drink a day. ? Be aware of how much alcohol is in your drink. In the [place] one drink equals one 12 oz bottle of beer (355 mL), one 5 oz glass of wine (148 mL), or one 1 oz glass of hard liquor (44 mL).   Lifestyle  Take daily care of your teeth and gums. Brush your teeth every morning and night with fluoride toothpaste. Floss one time each day.  Stay active. Exercise for at least 30 minutes 5 or more days each week.  Do not use any products  that contain nicotine or tobacco, such as cigarettes, e-cigarettes, and chewing tobacco. If you need help quitting, ask your health care provider.  Do not use drugs.  If you are sexually active, practice safe sex. Use a condom or other form of protection to prevent STIs (sexually transmitted infections).  If you do not wish to become pregnant, use a form of birth control. If you plan to become pregnant, see your health care provider for a prepregnancy visit.  Find healthy ways  to cope with stress, such as: ? Meditation, yoga, or listening to music. ? Journaling. ? Talking to a trusted person. ? Spending time with friends and family. Safety  Always wear your seat belt while driving or riding in a vehicle.  Do not drive: ? If you have been drinking alcohol. Do not ride with someone who has been drinking. ? When you are tired or distracted. ? While texting.  Wear a helmet and other protective equipment during sports activities.  If you have firearms in your house, make sure you follow all gun safety procedures.  Seek help if you have been physically or sexually abused. What's next?  Go to your health care provider once a year for an annual wellness visit.  Ask your health care provider how often you should have your eyes and teeth checked.  Stay up to date on all vaccines. This information is not intended to replace advice given to you by your health care provider. Make sure you discuss any questions you have with your health care provider. Document Revised: 04/26/2020 Document Reviewed: 05/10/2018 Elsevier Patient Education  2021 Elsevier Inc.  

## 2020-12-31 NOTE — Assessment & Plan Note (Signed)
Erythematous and draining pustule in right breast fold, left axilla and groin region. Bactrim rx sent Advised to hold this week's humira dose till complete oral abx Advised to also use warm compress and/or sitz bath

## 2020-12-31 NOTE — Progress Notes (Signed)
Subjective:    Patient ID: Annette Benitez, female    DOB: 08-28-1986, 35 y.o.   MRN: [id number]  Patient presents today for CPE and eval of chronic conditions  HPI Hidradenitis suppurativa Erythematous and draining pustule in right breast fold, left axilla and groin region. Bactrim rx sent Advised to hold this week's humira dose till complete oral abx Advised to also use warm compress and/or sitz bath  Sexual History (orientation,birth control, marital status, STD):breast and pelvic exam needed. Also agreed to STD screen today, asymptomatic, female partner, use of vaginal ring for contraception.  Depression/Suicide: Depression screen [facility] 2/9 12/31/2020  Decreased Interest 0  Down, Depressed, Hopeless 0  PHQ - 2 Score 0   Vision: up to date Dental:will schedule  Immunizations: (TDAP, Hep C screen, Pneumovax, Influenza, zoster)  Health Maintenance  Topic Date Due  .  Hepatitis C: One time screening is recommended by Center for Disease Control  (CDC) for  adults born from [birth year] through 1967.   Never done  . Pap Smear  11/12/2019  . COVID-19 Vaccine (3 - Inadvertent risk 4-dose series) 05/28/2020  . Flu Shot  04/12/2021  . Tetanus Vaccine  03/25/2027  . HIV Screening  Completed  . HPV Vaccine  Aged Out   Diet:regular Exercise: none Weight:  Wt Readings from Last 3 Encounters:  12/31/20 188 lb 9.6 oz (85.5 kg)  11/27/20 186 lb 12.8 oz (84.7 kg)  10/15/20 190 lb (86.2 kg)   Fall Risk: Fall Risk  12/31/2020  Falls in the past year? 0  Number falls in past yr: 0  Injury with Fall? 0  Risk for fall due to : No Fall Risks  Follow up Falls evaluation completed   Advanced Directive: Advanced Directives 10/15/2020  Does Patient Have a Medical Advance Directive? No  Would patient like information on creating a medical advance directive? No - Patient declined    Medications and allergies reviewed with patient and updated if appropriate.  Patient Active Problem List   Diagnosis  Date Noted  . Immunocompromised state due to drug therapy (HCC) 11/27/2020  . Encounter for initial prescription of vaginal ring hormonal contraceptive 11/27/2020  . Hx of herpes zoster 11/27/2020  . Hidradenitis suppurativa 11/11/2016    Current Outpatient Medications on File Prior to Visit  Medication Sig Dispense Refill  . Adalimumab 40 MG/0.8ML PSKT Inject into the skin.    Marland Kitchen etonogestrel-ethinyl estradiol (NUVARING) 0.12-0.015 MG/24HR vaginal ring Insert vaginally and leave in place for 4 consecutive weeks, then remove and replace. 3 each 4   No current facility-administered medications on file prior to visit.    Past Medical History:  Diagnosis Date  . Hidradenitis     Past Surgical History:  Procedure Laterality Date  . NO PAST SURGERIES      Social History   Socioeconomic History  . Marital status: Married    Spouse name: Not on file  . Number of children: Not on file  . Years of education: Not on file  . Highest education level: Not on file  Occupational History  . Not on file  Tobacco Use  . Smoking status: Former Games developer  . Smokeless tobacco: Never Used  Vaping Use  . Vaping Use: Never used  Substance and Sexual Activity  . Alcohol use: No    Comment: occasionally  . Drug use: No  . Sexual activity: Not Currently    Birth control/protection: None  Other Topics Concern  . Not on file  Social History  Narrative  . Not on file   Social Determinants of Health   Financial Resource Strain: Not on file  Food Insecurity: Not on file  Transportation Needs: Not on file  Physical Activity: Not on file  Stress: Not on file  Social Connections: Not on file    Family History  Problem Relation Age of Onset  . Hypertension Father         Review of Systems  Constitutional: Negative for fever, malaise/fatigue and weight loss.  HENT: Negative for congestion and sore throat.   Eyes:       Negative for visual changes  Respiratory: Negative for cough and  shortness of breath.   Cardiovascular: Negative for chest pain, palpitations and leg swelling.  Gastrointestinal: Negative for blood in stool, constipation, diarrhea and heartburn.  Genitourinary: Negative for dysuria, frequency and urgency.  Musculoskeletal: Negative for falls, joint pain and myalgias.  Skin: Positive for rash.  Neurological: Negative for dizziness, sensory change and headaches.  Endo/Heme/Allergies: Does not bruise/bleed easily.  Psychiatric/Behavioral: Negative for depression, substance abuse and suicidal ideas. The patient is not nervous/anxious.    Objective:   Vitals:   12/31/20 1142  BP: 116/72  Pulse: 81  Temp: 97.7 F (36.5 C)  SpO2: 100%   Body mass index is 28.89 kg/m.  Physical Examination:  Physical Exam Vitals reviewed. Exam conducted with a chaperone present.  Constitutional:      General: She is not in acute distress.    Appearance: She is well-developed. She is obese.  HENT:     Right Ear: Tympanic membrane, ear canal and external ear normal.     Left Ear: Tympanic membrane and external ear normal.  Eyes:     Extraocular Movements: Extraocular movements intact.     Conjunctiva/sclera: Conjunctivae normal.  Cardiovascular:     Rate and Rhythm: Normal rate and regular rhythm.     Pulses: Normal pulses.     Heart sounds: Normal heart sounds.  Pulmonary:     Effort: Pulmonary effort is normal. No respiratory distress.     Breath sounds: Normal breath sounds.  Chest:     Chest wall: No tenderness.  Breasts:     Right: Normal. No axillary adenopathy or supraclavicular adenopathy.     Left: Normal. No axillary adenopathy or supraclavicular adenopathy.    Abdominal:     General: Bowel sounds are normal.     Palpations: Abdomen is soft.     Hernia: There is no hernia in the left inguinal area or right inguinal area.  Genitourinary:    Labia:        Right: No rash or tenderness.        Left: No rash or tenderness.      Vagina: Normal.  No vaginal discharge.     Cervix: Normal.     Uterus: Normal.      Adnexa: Right adnexa normal and left adnexa normal.  Musculoskeletal:        General: Normal range of motion.     Cervical back: Normal range of motion and neck supple.     Right lower leg: No edema.     Left lower leg: No edema.  Lymphadenopathy:     Cervical: No cervical adenopathy.     Upper Body:     Right upper body: No supraclavicular, axillary or pectoral adenopathy.     Left upper body: No supraclavicular, axillary or pectoral adenopathy.     Lower Body: No right inguinal adenopathy. No left  inguinal adenopathy.  Skin:    Findings: Erythema and rash present. Rash is pustular.       Neurological:     Mental Status: She is alert and oriented to person, place, and time.     Deep Tendon Reflexes: Reflexes are normal and symmetric.  Psychiatric:        Mood and Affect: Mood normal.        Behavior: Behavior normal.        Thought Content: Thought content normal.    ASSESSMENT and PLAN: This visit occurred during the SARS-CoV-2 public health emergency.  Safety protocols were in place, including screening questions prior to the visit, additional usage of staff PPE, and extensive cleaning of exam room while observing appropriate contact time as indicated for disinfecting solutions.   [place] was seen today for annual exam.  Diagnoses and all orders for this visit:  Encounter for preventative adult health care exam with abnormal findings -     CBC with Differential/Platelet -     Comprehensive metabolic panel -     TSH -     Lipid panel -     Cytology - PAP( [place])  Hidradenitis suppurativa -     sulfamethoxazole-trimethoprim (BACTRIM DS) 800-160 MG tablet; Take 1 tablet by mouth 2 (two) times daily.  Encounter for lipid screening for cardiovascular disease -     Lipid panel  Screen for STD (sexually transmitted disease) -     HIV antibody (with reflex) -     RPR -     Hepatitis C Antibody -      Cervicovaginal ancillary only( [place])  Encounter for Papanicolaou smear for cervical cancer screening -     Cytology - PAP( [place])        Problem List Items Addressed This Visit      Musculoskeletal and Integument   Hidradenitis suppurativa    Erythematous and draining pustule in right breast fold, left axilla and groin region. Bactrim rx sent Advised to hold this week's humira dose till complete oral abx Advised to also use warm compress and/or sitz bath      Relevant Medications   sulfamethoxazole-trimethoprim (BACTRIM DS) 800-160 MG tablet    Other Visit Diagnoses    Encounter for preventative adult health care exam with abnormal findings    -  Primary   Relevant Orders   CBC with Differential/Platelet   Comprehensive metabolic panel   TSH   Lipid panel   Cytology - PAP( [place])   Encounter for lipid screening for cardiovascular disease       Relevant Orders   Lipid panel   Screen for STD (sexually transmitted disease)       Relevant Orders   HIV antibody (with reflex)   RPR   Hepatitis C Antibody   Cervicovaginal ancillary only( [place])   Encounter for Papanicolaou smear for cervical cancer screening       Relevant Orders   Cytology - PAP( [place])      Follow up: Return in about 1 year (around 12/31/2021) for CPE (fasting).  Alysia Penna, NP

## 2021-01-01 LAB — HIV ANTIBODY (ROUTINE TESTING W REFLEX): HIV 1&2 Ab, 4th Generation: NONREACTIVE

## 2021-01-01 LAB — RPR: RPR Ser Ql: NONREACTIVE

## 2021-01-01 LAB — HEPATITIS C ANTIBODY
Hepatitis C Ab: NONREACTIVE
SIGNAL TO CUT-OFF: 0.01 (ref <1.00)

## 2021-01-04 LAB — CERVICOVAGINAL ANCILLARY ONLY
Bacterial Vaginitis (gardnerella): NEGATIVE
Candida Glabrata: NEGATIVE
Candida Vaginitis: NEGATIVE
Chlamydia: NEGATIVE
Comment: NEGATIVE
Comment: NEGATIVE
Comment: NEGATIVE
Comment: NEGATIVE
Comment: NEGATIVE
Comment: NEGATIVE
Comment: NORMAL
HSV1: NEGATIVE
HSV2: NEGATIVE
Neisseria Gonorrhea: NEGATIVE
Trichomonas: NEGATIVE

## 2021-01-04 LAB — CYTOLOGY - PAP
Comment: NEGATIVE
Diagnosis: NEGATIVE
High risk HPV: NEGATIVE

## 2021-02-19 DIAGNOSIS — L732 Hidradenitis suppurativa: Secondary | ICD-10-CM | POA: Diagnosis not present

## 2021-03-26 IMAGING — DX DG CHEST 2V
2 series · 2 of 2 positions shown · non-contrast
Comparison: None.

CLINICAL DATA: Right-sided chest pain

EXAM:
CHEST - 2 VIEW

[chest pa]
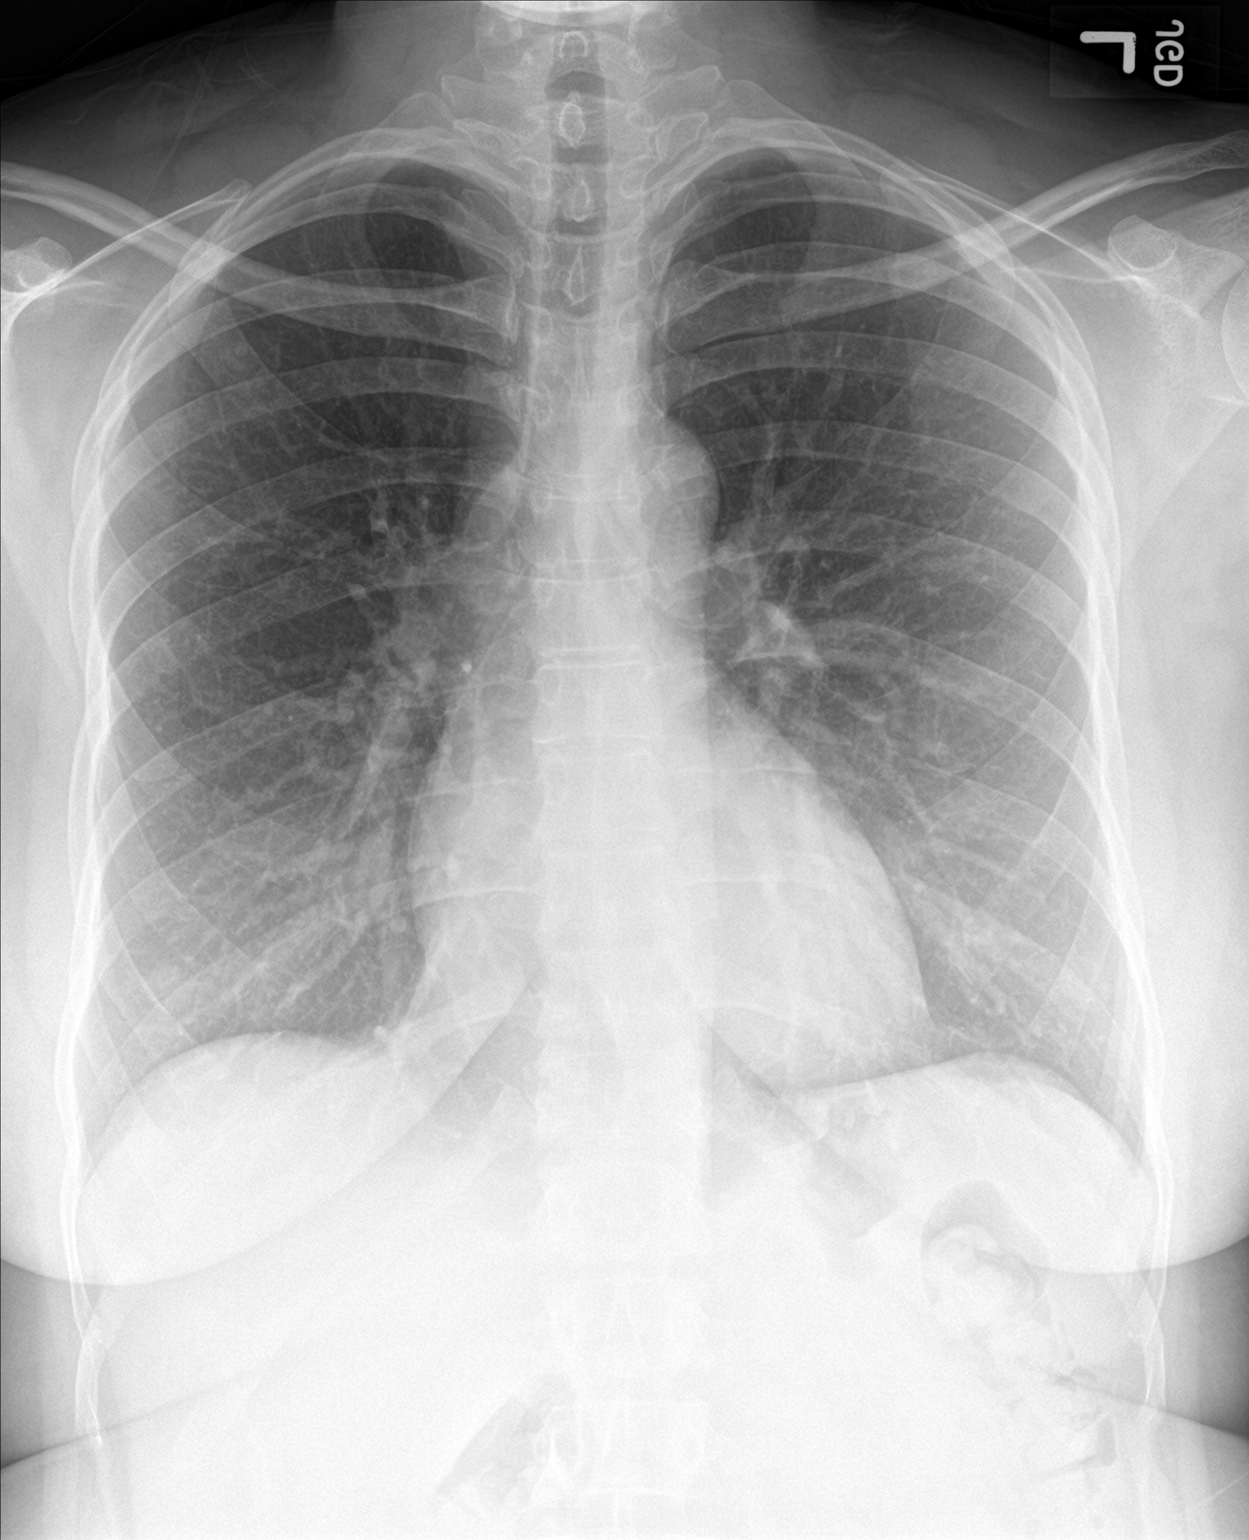

[chest lat]
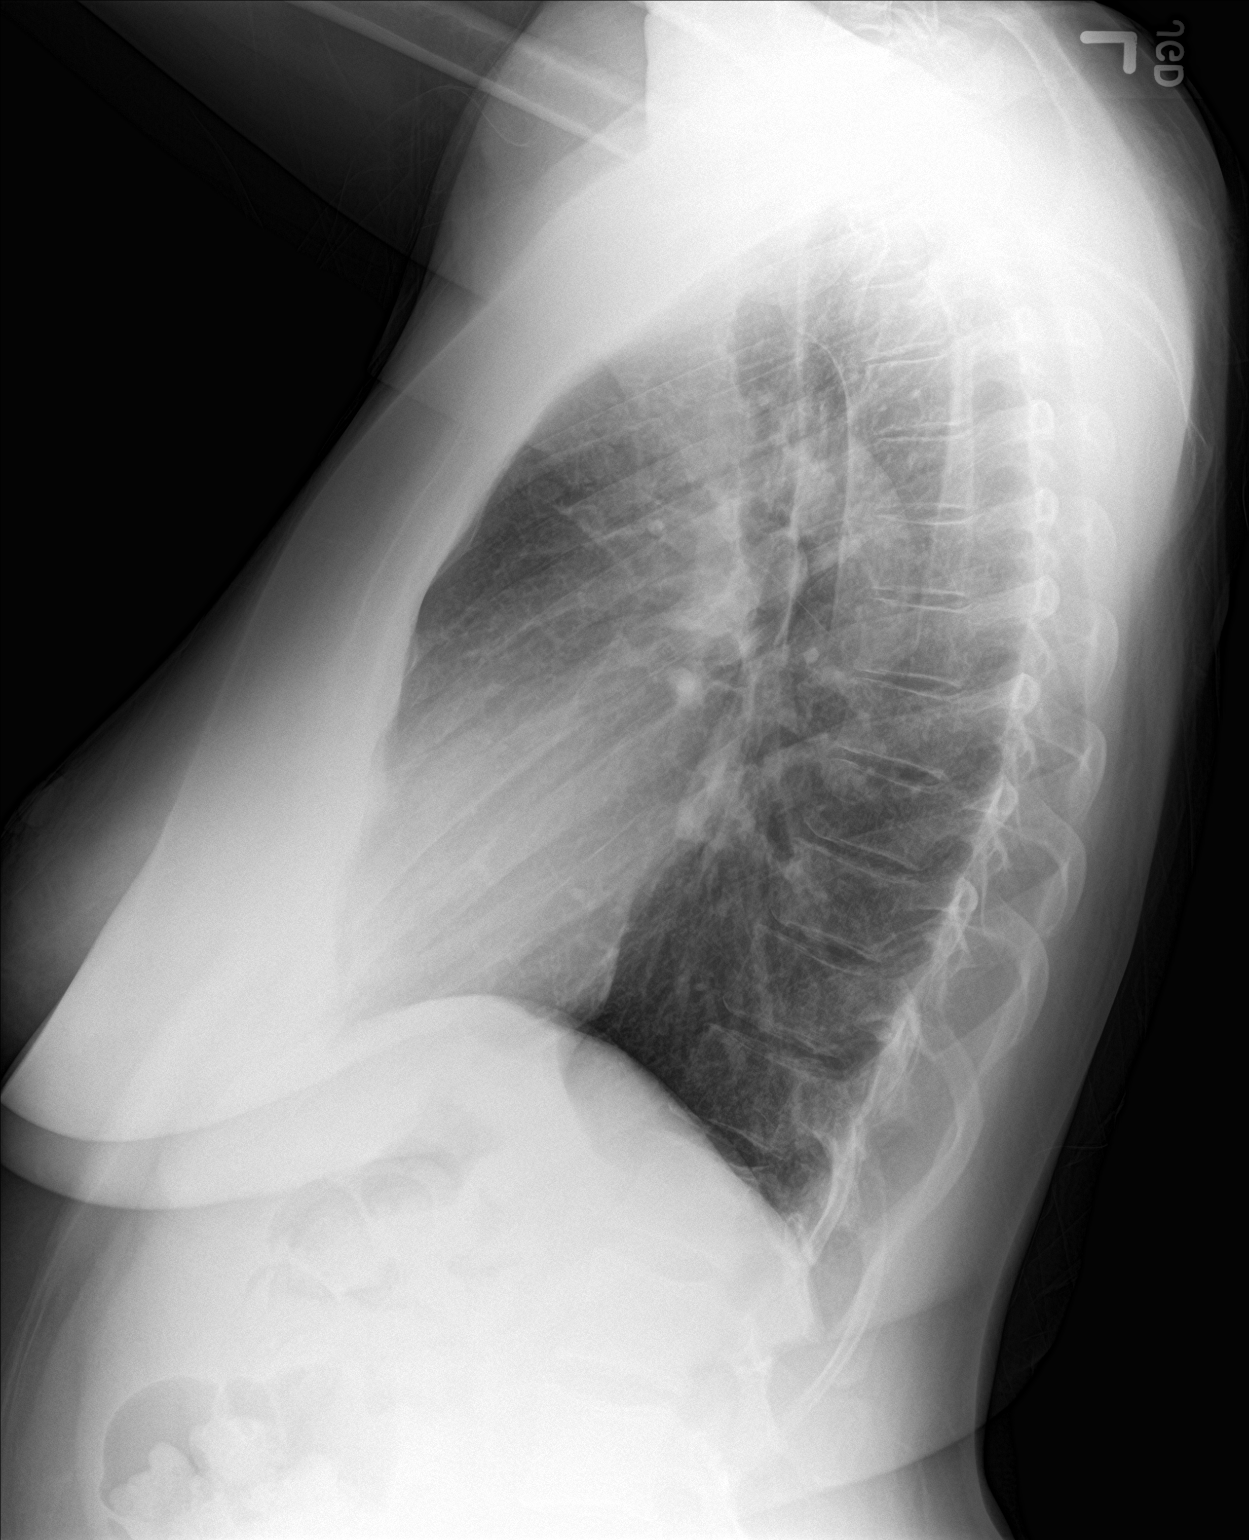

[2 of 2 positions shown; findings below may reference images not displayed]

FINDINGS: The heart size and mediastinal contours are within normal limits. 7
mm nodular density projecting at the peripheral aspect of the right
lung base may represent nipple shadow. Subtle nodular airspace
opacity at this location not excluded. Elsewhere, lungs are clear.
No pleural effusion or pneumothorax. The visualized skeletal
structures are unremarkable.
IMPRESSION: 7 mm nodular density projecting at the peripheral aspect of the
right lung base may represent nipple shadow. Subtle nodular airspace
opacity at this location not excluded. Recommend repeat radiographs
with nipple markers.

## 2021-10-04 DIAGNOSIS — L732 Hidradenitis suppurativa: Secondary | ICD-10-CM | POA: Diagnosis not present

## 2022-01-03 ENCOUNTER — Encounter: Payer: Self-pay | Admitting: Nurse Practitioner

## 2022-01-03 ENCOUNTER — Ambulatory Visit (INDEPENDENT_AMBULATORY_CARE_PROVIDER_SITE_OTHER): Payer: Federal, State, Local not specified - PPO | Admitting: Nurse Practitioner

## 2022-01-03 ENCOUNTER — Encounter: Payer: Federal, State, Local not specified - PPO | Admitting: Nurse Practitioner

## 2022-01-03 VITALS — BP 110/78 | HR 84 | Temp 97.5°F | Ht 66.75 in | Wt 194.4 lb

## 2022-01-03 DIAGNOSIS — Z13220 Encounter for screening for lipoid disorders: Secondary | ICD-10-CM

## 2022-01-03 DIAGNOSIS — Z79899 Other long term (current) drug therapy: Secondary | ICD-10-CM

## 2022-01-03 DIAGNOSIS — D84821 Immunodeficiency due to drugs: Secondary | ICD-10-CM

## 2022-01-03 DIAGNOSIS — Z3044 Encounter for surveillance of vaginal ring hormonal contraceptive device: Secondary | ICD-10-CM

## 2022-01-03 DIAGNOSIS — E559 Vitamin D deficiency, unspecified: Secondary | ICD-10-CM | POA: Diagnosis not present

## 2022-01-03 DIAGNOSIS — Z0000 Encounter for general adult medical examination without abnormal findings: Secondary | ICD-10-CM

## 2022-01-03 DIAGNOSIS — D500 Iron deficiency anemia secondary to blood loss (chronic): Secondary | ICD-10-CM

## 2022-01-03 DIAGNOSIS — Z136 Encounter for screening for cardiovascular disorders: Secondary | ICD-10-CM | POA: Diagnosis not present

## 2022-01-03 LAB — CBC WITH DIFFERENTIAL/PLATELET
Basophils Absolute: 0.1 10*3/uL (ref 0.0–0.1)
Basophils Relative: 0.6 % (ref 0.0–3.0)
Eosinophils Absolute: 0.1 10*3/uL (ref 0.0–0.7)
Eosinophils Relative: 0.7 % (ref 0.0–5.0)
HCT: 35.3 % — ABNORMAL LOW (ref 36.0–46.0)
Hemoglobin: 11.6 g/dL — ABNORMAL LOW (ref 12.0–15.0)
Lymphocytes Relative: 43 % (ref 12.0–46.0)
Lymphs Abs: 4.7 10*3/uL — ABNORMAL HIGH (ref 0.7–4.0)
MCHC: 32.8 g/dL (ref 30.0–36.0)
MCV: 92.8 fl (ref 78.0–100.0)
Monocytes Absolute: 0.6 10*3/uL (ref 0.1–1.0)
Monocytes Relative: 6 % (ref 3.0–12.0)
Neutro Abs: 5.4 10*3/uL (ref 1.4–7.7)
Neutrophils Relative %: 49.7 % (ref 43.0–77.0)
Platelets: 257 10*3/uL (ref 150.0–400.0)
RBC: 3.8 Mil/uL — ABNORMAL LOW (ref 3.87–5.11)
RDW: 14.2 % (ref 11.5–15.5)
WBC: 10.8 10*3/uL — ABNORMAL HIGH (ref 4.0–10.5)

## 2022-01-03 LAB — COMPREHENSIVE METABOLIC PANEL
ALT: 15 U/L (ref 0–35)
AST: 16 U/L (ref 0–37)
Albumin: 4.3 g/dL (ref 3.5–5.2)
Alkaline Phosphatase: 61 U/L (ref 39–117)
BUN: 14 mg/dL (ref 6–23)
CO2: 25 mEq/L (ref 19–32)
Calcium: 9.3 mg/dL (ref 8.4–10.5)
Chloride: 102 mEq/L (ref 96–112)
Creatinine, Ser: 0.62 mg/dL (ref 0.40–1.20)
GFR: 113.61 mL/min (ref >60.00)
Glucose, Bld: 96 mg/dL (ref 70–99)
Potassium: 4.2 mEq/L (ref 3.5–5.1)
Sodium: 135 mEq/L (ref 135–145)
Total Bilirubin: 0.4 mg/dL (ref 0.2–1.2)
Total Protein: 8.2 g/dL (ref 6.0–8.3)

## 2022-01-03 LAB — LIPID PANEL
Cholesterol: 218 mg/dL — ABNORMAL HIGH (ref 0–200)
HDL: 55.5 mg/dL (ref >39.00)
LDL Cholesterol: 141 mg/dL — ABNORMAL HIGH (ref 0–99)
NonHDL: 162.94
Total CHOL/HDL Ratio: 4
Triglycerides: 110 mg/dL (ref 0.0–149.0)
VLDL: 22 mg/dL (ref 0.0–40.0)

## 2022-01-03 LAB — TSH: TSH: 0.77 u[IU]/mL (ref 0.35–5.50)

## 2022-01-03 LAB — VITAMIN D 25 HYDROXY (VIT D DEFICIENCY, FRACTURES): VITD: 11.28 ng/mL — ABNORMAL LOW (ref 30.00–100.00)

## 2022-01-03 MED ORDER — ETONOGESTREL-ETHINYL ESTRADIOL 0.12-0.015 MG/24HR VA RING: VAGINAL_RING | VAGINAL | 4 refills | Status: DC

## 2022-01-03 NOTE — Progress Notes (Signed)
? ?Complete physical exam ? ?Patient: Annette Benitez   DOB: 25-Mar-1986   35 y.o. Female  MRN: [id number] ?Visit Date: 01/03/2022 ? ?Subjective:  ?  ?Chief Complaint  ?Patient presents with  ? Annual Exam  ? ?Annette Benitez is a 36 y.o. female who presents today for a complete physical exam. She reports consuming a general diet.  No exercise  She generally feels well. She reports sleeping well. She does not have additional problems to discuss today.  ?Vision:No ?Dental:Yes ?STD Screen:No ? ?Wt Readings from Last 3 Encounters:  ?01/03/22 194 lb 6.4 oz (88.2 kg)  ?12/31/20 188 lb 9.6 oz (85.5 kg)  ?11/27/20 186 lb 12.8 oz (84.7 kg)  ?  ?Most recent fall risk assessment: ? ?  12/31/2020  ? 11:46 AM  ?Fall Risk   ?Falls in the past year? 0  ?Number falls in past yr: 0  ?Injury with Fall? 0  ?Risk for fall due to : No Fall Risks  ?Follow up Falls evaluation completed  ? ?Most recent depression screenings: ? ?  01/03/2022  ? 11:19 AM 12/31/2020  ? 12:25 PM  ?PHQ 2/9 Scores  ?PHQ - 2 Score 0 0  ?PHQ- 9 Score 6   ? ? ?  01/03/2022  ? 11:19 AM 12/31/2020  ? 12:25 PM  ?Depression screen PHQ 2/9  ?Decreased Interest 0 0  ?Down, Depressed, Hopeless 0 0  ?PHQ - 2 Score 0 0  ?Altered sleeping 2   ?Tired, decreased energy 2   ?Change in appetite 2   ?Feeling bad or failure about yourself  0   ?Trouble concentrating 0   ?Moving slowly or fidgety/restless 0   ?Suicidal thoughts 0   ?PHQ-9 Score 6   ?Difficult doing work/chores Not difficult at all   ?  ? ?  01/03/2022  ? 11:20 AM 12/31/2020  ? 12:25 PM  ?GAD 7 : Generalized Anxiety Score  ?Nervous, Anxious, on Edge 2 0  ?Control/stop worrying 3 0  ?Worry too much - different things 3 0  ?Trouble relaxing 3 0  ?Restless 0 0  ?Easily annoyed or irritable 2 0  ?Afraid - awful might happen 2 0  ?Total GAD 7 Score 15 0  ?Anxiety Difficulty Not difficult at all   ? ?HPI ?No problem-specific Assessment & Plan notes found for this encounter. ? ? ?Past Medical History:  ?Diagnosis Date  ? Hidradenitis    ? ?Past Surgical History:  ?Procedure Laterality Date  ? NO PAST SURGERIES    ? ?Social History  ? ?Socioeconomic History  ? Marital status: Married  ?  Spouse name: Not on file  ? Number of children: 3  ? Years of education: Not on file  ? Highest education level: Not on file  ?Occupational History  ? Not on file  ?Tobacco Use  ? Smoking status: Light Smoker  ?  Packs/day: 0.25  ?  Years: 15.00  ?  Pack years: 3.75  ?  Types: Cigarettes  ? Smokeless tobacco: Never  ?Vaping Use  ? Vaping Use: Never used  ?Substance and Sexual Activity  ? Alcohol use: Yes  ?  Alcohol/week: 6.0 standard drinks  ?  Types: 3 Glasses of wine, 3 Shots of liquor per week  ?  Comment: occasionally  ? Drug use: No  ? Sexual activity: Yes  ?  Birth control/protection: Other-see comments  ?  Comment: Nuvaring  ?Other Topics Concern  ? Not on file  ?Social History Narrative  ? Not on  file  ? ?Social Determinants of Health  ? ?Financial Resource Strain: Not on file  ?Food Insecurity: Not on file  ?Transportation Needs: Not on file  ?Physical Activity: Not on file  ?Stress: Not on file  ?Social Connections: Not on file  ?Intimate Partner Violence: Not on file  ? ?Family Status  ?Relation Name Status  ? Father Burton ApleyRonnie Lawson (Not Specified)  ? Mother LaVita Hart RochesterLawson (Not Specified)  ? Son Cory MunchRinaldo Hoot Jr. (Not Specified)  ? ?Family History  ?Problem Relation Age of Onset  ? Hypertension Father   ? Obesity Mother   ? Varicose Veins Mother   ? ADD / ADHD Son   ? ?No Known Allergies  ?Patient Care Team: ?Vi Biddinger, Bonna Gainsharlotte Lum, NP as PCP - General (Internal Medicine) ?Baumgardner, Catarina Hartshornachael N, RN as Designer, jewelleryegistered Nurse  ? ?Medications: ?Outpatient Medications Prior to Visit  ?Medication Sig  ? Adalimumab 40 MG/0.8ML PSKT Inject into the skin.  ? [DISCONTINUED] etonogestrel-ethinyl estradiol (NUVARING) 0.12-0.015 MG/24HR vaginal ring Insert vaginally and leave in place for 4 consecutive weeks, then remove and replace.  ? [DISCONTINUED]  sulfamethoxazole-trimethoprim (BACTRIM DS) 800-160 MG tablet Take 1 tablet by mouth 2 (two) times daily. (Patient not taking: Reported on 01/03/2022)  ? ?No facility-administered medications prior to visit.  ? ? ?Review of Systems  ?Constitutional:  Negative for fever.  ?HENT:  Negative for congestion and sore throat.   ?Eyes:   ?     Negative for visual changes  ?Respiratory:  Negative for cough and shortness of breath.   ?Cardiovascular:  Negative for chest pain, palpitations and leg swelling.  ?Gastrointestinal:  Negative for blood in stool, constipation and diarrhea.  ?Genitourinary:  Negative for dysuria, frequency and urgency.  ?Musculoskeletal:  Negative for myalgias.  ?Skin:  Negative for rash.  ?Neurological:  Negative for dizziness and headaches.  ?Hematological:  Does not bruise/bleed easily.  ?Psychiatric/Behavioral:  Negative for behavioral problems, sleep disturbance and suicidal ideas. The patient is not nervous/anxious.   ? ? ?   ?Objective:  ?BP 110/78 (BP Location: Left Arm, Patient Position: Sitting, Cuff Size: Large)   Pulse 84   Temp (!) 97.5 ?F (36.4 ?C) (Temporal)   Ht 5' 6.75" (1.695 m)   Wt 194 lb 6.4 oz (88.2 kg)   LMP 12/10/2021 (Exact Date)   SpO2 98%   BMI 30.68 kg/m?  ?  ? ? ? ?Physical Exam ?Vitals reviewed.  ?Constitutional:   ?   General: She is not in acute distress. ?   Appearance: She is well-developed.  ?HENT:  ?   Right Ear: Tympanic membrane, ear canal and external ear normal.  ?   Left Ear: Tympanic membrane, ear canal and external ear normal.  ?Eyes:  ?   Extraocular Movements: Extraocular movements intact.  ?   Conjunctiva/sclera: Conjunctivae normal.  ?   Pupils: Pupils are equal, round, and reactive to light.  ?Cardiovascular:  ?   Rate and Rhythm: Normal rate and regular rhythm.  ?   Pulses: Normal pulses.  ?   Heart sounds: Normal heart sounds.  ?Pulmonary:  ?   Effort: Pulmonary effort is normal. No respiratory distress.  ?   Breath sounds: Normal breath sounds.   ?Chest:  ?   Chest wall: No tenderness.  ?Breasts: ?   Breasts are symmetrical.  ?   Right: Normal.  ?   Left: Normal.  ?Abdominal:  ?   General: Bowel sounds are normal.  ?   Palpations: Abdomen is soft.  ?  Musculoskeletal:     ?   General: Normal range of motion.  ?   Cervical back: Normal range of motion and neck supple.  ?   Right lower leg: No edema.  ?   Left lower leg: No edema.  ?Lymphadenopathy:  ?   Cervical: No cervical adenopathy.  ?   Upper Body:  ?   Right upper body: No supraclavicular, axillary or pectoral adenopathy.  ?   Left upper body: No supraclavicular, axillary or pectoral adenopathy.  ?Skin: ?   General: Skin is warm and dry.  ?Neurological:  ?   Mental Status: She is alert and oriented to person, place, and time.  ?   Deep Tendon Reflexes: Reflexes are normal and symmetric.  ?Psychiatric:     ?   Mood and Affect: Mood normal.     ?   Behavior: Behavior normal.     ?   Thought Content: Thought content normal.  ?  ?No results found for any visits on 01/03/22. ?   ?Assessment & Plan:  ?  ?Routine Health Maintenance and Physical Exam ? ?Immunization History  ?Administered Date(s) Administered  ? PFIZER Comirnaty(Gray Top)Covid-19 Tri-Sucrose Vaccine 04/17/2020, 04/30/2020  ? Tdap 03/24/2017  ? Zoster Recombinat (Shingrix) 11/27/2020  ? ? ?Health Maintenance  ?Topic Date Due  ? COVID-19 Vaccine (3 - Mixed Product risk series) 05/28/2020  ? INFLUENZA VACCINE  04/12/2022  ? PAP SMEAR-Modifier  01/01/2024  ? TETANUS/TDAP  03/25/2027  ? Hepatitis C Screening  Completed  ? HIV Screening  Completed  ? HPV VACCINES  Aged Out  ? ?Discussed health benefits of physical activity, and encouraged her to engage in regular exercise appropriate for her age and condition. ? ?Problem List Items Addressed This Visit   ? ?  ? Other  ? Immunocompromised state due to drug therapy [facility])  ? ?Other Visit Diagnoses   ? ? Preventative health care    -  Primary  ? Relevant Orders  ? Comprehensive metabolic panel  ? TSH  ?  CBC with Differential/Platelet  ? Lipid panel  ? Encounter for surveillance of vaginal ring hormonal contraceptive device      ? Relevant Medications  ? etonogestrel-ethinyl estradiol (NUVARING) 0.12-0.015 MG/24HR vaginal ring  ? E

## 2022-01-03 NOTE — Patient Instructions (Signed)
Go to lab for blood draw.  Preventive Care 21-39 Years Old, Female Preventive care refers to lifestyle choices and visits with your health care provider that can promote health and wellness. Preventive care visits are also called wellness exams. What can I expect for my preventive care visit? Counseling During your preventive care visit, your health care provider may ask about your: Medical history, including: Past medical problems. Family medical history. Pregnancy history. Current health, including: Menstrual cycle. Method of birth control. Emotional well-being. Home life and relationship well-being. Sexual activity and sexual health. Lifestyle, including: Alcohol, nicotine or tobacco, and drug use. Access to firearms. Diet, exercise, and sleep habits. Work and work environment. Sunscreen use. Safety issues such as seatbelt and bike helmet use. Physical exam Your health care provider may check your: Height and weight. These may be used to calculate your BMI (body mass index). BMI is a measurement that tells if you are at a healthy weight. Waist circumference. This measures the distance around your waistline. This measurement also tells if you are at a healthy weight and may help predict your risk of certain diseases, such as type 2 diabetes and high blood pressure. Heart rate and blood pressure. Body temperature. Skin for abnormal spots. What immunizations do I need?  Vaccines are usually given at various ages, according to a schedule. Your health care provider will recommend vaccines for you based on your age, medical history, and lifestyle or other factors, such as travel or where you work. What tests do I need? Screening Your health care provider may recommend screening tests for certain conditions. This may include: Pelvic exam and Pap test. Lipid and cholesterol levels. Diabetes screening. This is done by checking your blood sugar (glucose) after you have not eaten for a  while (fasting). Hepatitis B test. Hepatitis C test. HIV (human immunodeficiency virus) test. STI (sexually transmitted infection) testing, if you are at risk. BRCA-related cancer screening. This may be done if you have a family history of breast, ovarian, tubal, or peritoneal cancers. Talk with your health care provider about your test results, treatment options, and if necessary, the need for more tests. Follow these instructions at home: Eating and drinking  Eat a healthy diet that includes fresh fruits and vegetables, whole grains, lean protein, and low-fat dairy products. Take vitamin and mineral supplements as recommended by your health care provider. Do not drink alcohol if: Your health care provider tells you not to drink. You are pregnant, may be pregnant, or are planning to become pregnant. If you drink alcohol: Limit how much you have to 0-1 drink a day. Know how much alcohol is in your drink. In the [place] one drink equals one 12 oz bottle of beer (355 mL), one 5 oz glass of wine (148 mL), or one 1 oz glass of hard liquor (44 mL). Lifestyle Brush your teeth every morning and night with fluoride toothpaste. Floss one time each day. Exercise for at least 30 minutes 5 or more days each week. Do not use any products that contain nicotine or tobacco. These products include cigarettes, chewing tobacco, and vaping devices, such as e-cigarettes. If you need help quitting, ask your health care provider. Do not use drugs. If you are sexually active, practice safe sex. Use a condom or other form of protection to prevent STIs. If you do not wish to become pregnant, use a form of birth control. If you plan to become pregnant, see your health care provider for a prepregnancy visit. Find healthy   ways to manage stress, such as: ?Meditation, yoga, or listening to music. ?Journaling. ?Talking to a trusted person. ?Spending time with friends and family. ?Minimize exposure to UV radiation to reduce  your risk of skin cancer. ?Safety ?Always wear your seat belt while driving or riding in a vehicle. ?Do not drive: ?If you have been drinking alcohol. Do not ride with someone who has been drinking. ?If you have been using any mind-altering substances or drugs. ?While texting. ?When you are tired or distracted. ?Wear a helmet and other protective equipment during sports activities. ?If you have firearms in your house, make sure you follow all gun safety procedures. ?Seek help if you have been physically or sexually abused. ?What's next? ?Go to your health care provider once a year for an annual wellness visit. ?Ask your health care provider how often you should have your eyes and teeth checked. ?Stay up to date on all vaccines. ?This information is not intended to replace advice given to you by your health care provider. Make sure you discuss any questions you have with your health care provider. ?Document Revised: 02/24/2021 Document Reviewed: 02/24/2021 ?Elsevier Patient Education ? [place]. ? ?

## 2022-01-05 ENCOUNTER — Encounter: Payer: Self-pay | Admitting: Nurse Practitioner

## 2022-01-05 DIAGNOSIS — E559 Vitamin D deficiency, unspecified: Secondary | ICD-10-CM | POA: Insufficient documentation

## 2022-01-05 DIAGNOSIS — D500 Iron deficiency anemia secondary to blood loss (chronic): Secondary | ICD-10-CM

## 2022-01-05 HISTORY — DX: Iron deficiency anemia secondary to blood loss (chronic): D50.0

## 2022-01-05 MED ORDER — VITAMIN D (ERGOCALCIFEROL) 1.25 MG (50000 UNIT) PO CAPS: 50000.0000 [IU] | ORAL_CAPSULE | ORAL | 0 refills | Status: DC

## 2022-01-05 NOTE — Addendum Note (Signed)
Addended by: Alysia Penna L on: 01/05/2022 02:06 PM ? ? Modules accepted: Orders ? ?

## 2022-03-18 ENCOUNTER — Encounter (HOSPITAL_COMMUNITY): Payer: Self-pay

## 2022-03-18 ENCOUNTER — Emergency Department (HOSPITAL_COMMUNITY): Payer: Federal, State, Local not specified - PPO

## 2022-03-18 ENCOUNTER — Inpatient Hospital Stay (HOSPITAL_COMMUNITY)
Admission: EM | Admit: 2022-03-18 | Discharge: 2022-03-24 | DRG: 123 | Disposition: A | Discharge status: Home / Self Care | Payer: Federal, State, Local not specified - PPO | Attending: Internal Medicine | Admitting: Internal Medicine

## 2022-03-18 ENCOUNTER — Other Ambulatory Visit: Payer: Self-pay

## 2022-03-18 DIAGNOSIS — H53411 Scotoma involving central area, right eye: Secondary | ICD-10-CM | POA: Diagnosis not present

## 2022-03-18 DIAGNOSIS — E876 Hypokalemia: Secondary | ICD-10-CM | POA: Diagnosis present

## 2022-03-18 DIAGNOSIS — Z79899 Other long term (current) drug therapy: Secondary | ICD-10-CM | POA: Diagnosis not present

## 2022-03-18 DIAGNOSIS — H54413A Blindness right eye category 3, normal vision left eye: Secondary | ICD-10-CM | POA: Diagnosis not present

## 2022-03-18 DIAGNOSIS — F17210 Nicotine dependence, cigarettes, uncomplicated: Secondary | ICD-10-CM | POA: Diagnosis not present

## 2022-03-18 DIAGNOSIS — L732 Hidradenitis suppurativa: Secondary | ICD-10-CM | POA: Diagnosis not present

## 2022-03-18 DIAGNOSIS — R739 Hyperglycemia, unspecified: Secondary | ICD-10-CM | POA: Diagnosis not present

## 2022-03-18 DIAGNOSIS — E669 Obesity, unspecified: Secondary | ICD-10-CM | POA: Diagnosis not present

## 2022-03-18 DIAGNOSIS — Z6831 Body mass index (BMI) 31.0-31.9, adult: Secondary | ICD-10-CM | POA: Diagnosis not present

## 2022-03-18 DIAGNOSIS — Z8249 Family history of ischemic heart disease and other diseases of the circulatory system: Secondary | ICD-10-CM

## 2022-03-18 DIAGNOSIS — R4182 Altered mental status, unspecified: Secondary | ICD-10-CM | POA: Diagnosis not present

## 2022-03-18 DIAGNOSIS — H469 Unspecified optic neuritis: Secondary | ICD-10-CM | POA: Diagnosis not present

## 2022-03-18 DIAGNOSIS — H5461 Unqualified visual loss, right eye, normal vision left eye: Secondary | ICD-10-CM | POA: Diagnosis not present

## 2022-03-18 DIAGNOSIS — I619 Nontraumatic intracerebral hemorrhage, unspecified: Secondary | ICD-10-CM | POA: Diagnosis not present

## 2022-03-18 LAB — POTASSIUM: Potassium: 3.4 mmol/L — ABNORMAL LOW (ref 3.5–5.1)

## 2022-03-18 LAB — CBC WITH DIFFERENTIAL/PLATELET
Abs Immature Granulocytes: 0.03 10*3/uL (ref 0.00–0.07)
Basophils Absolute: 0 10*3/uL (ref 0.0–0.1)
Basophils Relative: 0 %
Eosinophils Absolute: 0.1 10*3/uL (ref 0.0–0.5)
Eosinophils Relative: 1 %
HCT: 34.3 % — ABNORMAL LOW (ref 36.0–46.0)
Hemoglobin: 11 g/dL — ABNORMAL LOW (ref 12.0–15.0)
Immature Granulocytes: 0 %
Lymphocytes Relative: 41 %
Lymphs Abs: 5.3 10*3/uL — ABNORMAL HIGH (ref 0.7–4.0)
MCH: 30.4 pg (ref 26.0–34.0)
MCHC: 32.1 g/dL (ref 30.0–36.0)
MCV: 94.8 fL (ref 80.0–100.0)
Monocytes Absolute: 0.7 10*3/uL (ref 0.1–1.0)
Monocytes Relative: 5 %
Neutro Abs: 6.9 10*3/uL (ref 1.7–7.7)
Neutrophils Relative %: 53 %
Platelets: 279 10*3/uL (ref 150–400)
RBC: 3.62 MIL/uL — ABNORMAL LOW (ref 3.87–5.11)
RDW: 12.9 % (ref 11.5–15.5)
WBC: 13.1 10*3/uL — ABNORMAL HIGH (ref 4.0–10.5)
nRBC: 0 % (ref 0.0–0.2)

## 2022-03-18 LAB — BASIC METABOLIC PANEL
Anion gap: 9 (ref 5–15)
BUN: 10 mg/dL (ref 6–20)
CO2: 20 mmol/L — ABNORMAL LOW (ref 22–32)
Calcium: 8.3 mg/dL — ABNORMAL LOW (ref 8.9–10.3)
Chloride: 109 mmol/L (ref 98–111)
Creatinine, Ser: 0.66 mg/dL (ref 0.44–1.00)
GFR, Estimated: >60 mL/min (ref >60)
Glucose, Bld: 89 mg/dL (ref 70–99)
Potassium: 5.5 mmol/L — ABNORMAL HIGH (ref 3.5–5.1)
Sodium: 138 mmol/L (ref 135–145)

## 2022-03-18 LAB — SEDIMENTATION RATE: Sed Rate: 24 mm/hr — ABNORMAL HIGH (ref 0–22)

## 2022-03-18 MED ORDER — METHYLPREDNISOLONE SODIUM SUCC 1000 MG IJ SOLR
1000.0000 mg | Freq: Every day | INTRAMUSCULAR | Status: AC
  Administered 2022-03-19 – 2022-03-22 (×4): 1000 mg via INTRAVENOUS
  Filled 2022-03-18 (×6): qty 16

## 2022-03-18 MED ORDER — SODIUM CHLORIDE 0.9 % IV SOLN
1000.0000 mg | Freq: Once | INTRAVENOUS | Status: AC
  Administered 2022-03-18: 1000 mg via INTRAVENOUS
  Filled 2022-03-18: qty 16

## 2022-03-18 MED ORDER — FLUORESCEIN SODIUM 1 MG OP STRP
1.0000 | ORAL_STRIP | Freq: Once | OPHTHALMIC | Status: AC
  Administered 2022-03-18: 1 via OPHTHALMIC
  Filled 2022-03-18: qty 1

## 2022-03-18 MED ORDER — SODIUM CHLORIDE 0.9 % IV BOLUS
1000.0000 mL | Freq: Once | INTRAVENOUS | Status: AC
  Administered 2022-03-18: 1000 mL via INTRAVENOUS

## 2022-03-18 MED ORDER — ONDANSETRON HCL 4 MG PO TABS: 4.0000 mg | ORAL_TABLET | Freq: Four times a day (QID) | ORAL | Status: DC | PRN

## 2022-03-18 MED ORDER — TETRACAINE HCL 0.5 % OP SOLN
1.0000 [drp] | Freq: Once | OPHTHALMIC | Status: AC
  Administered 2022-03-18: 1 [drp] via OPHTHALMIC
  Filled 2022-03-18: qty 4

## 2022-03-18 MED ORDER — DIPHENHYDRAMINE HCL 50 MG/ML IJ SOLN
12.5000 mg | Freq: Once | INTRAMUSCULAR | Status: AC
  Administered 2022-03-18: 12.5 mg via INTRAVENOUS
  Filled 2022-03-18: qty 1

## 2022-03-18 MED ORDER — GADOBUTROL 1 MMOL/ML IV SOLN
8.5000 mL | Freq: Once | INTRAVENOUS | Status: AC | PRN
  Administered 2022-03-18: 8.5 mL via INTRAVENOUS

## 2022-03-18 MED ORDER — ONDANSETRON HCL 4 MG/2ML IJ SOLN: 4.0000 mg | Freq: Four times a day (QID) | INTRAMUSCULAR | Status: DC | PRN

## 2022-03-18 MED ORDER — SENNOSIDES-DOCUSATE SODIUM 8.6-50 MG PO TABS: 1.0000 | ORAL_TABLET | Freq: Every evening | ORAL | Status: DC | PRN

## 2022-03-18 MED ORDER — ACETAMINOPHEN 650 MG RE SUPP: 650.0000 mg | Freq: Four times a day (QID) | RECTAL | Status: DC | PRN

## 2022-03-18 MED ORDER — KETOROLAC TROMETHAMINE 30 MG/ML IJ SOLN
30.0000 mg | Freq: Once | INTRAMUSCULAR | Status: AC
  Administered 2022-03-18: 30 mg via INTRAVENOUS
  Filled 2022-03-18: qty 1

## 2022-03-18 MED ORDER — ENOXAPARIN SODIUM 40 MG/0.4ML IJ SOSY
40.0000 mg | PREFILLED_SYRINGE | INTRAMUSCULAR | Status: DC
  Administered 2022-03-19 – 2022-03-24 (×6): 40 mg via SUBCUTANEOUS
  Filled 2022-03-18 (×6): qty 0.4

## 2022-03-18 MED ORDER — ACETAMINOPHEN 325 MG PO TABS
650.0000 mg | ORAL_TABLET | Freq: Four times a day (QID) | ORAL | Status: DC | PRN
  Administered 2022-03-19 – 2022-03-22 (×5): 650 mg via ORAL
  Filled 2022-03-18 (×5): qty 2

## 2022-03-18 MED ORDER — POTASSIUM CHLORIDE 20 MEQ PO PACK
40.0000 meq | PACK | Freq: Once | ORAL | Status: AC
  Administered 2022-03-19: 40 meq via ORAL
  Filled 2022-03-18: qty 2

## 2022-03-18 MED ORDER — METOCLOPRAMIDE HCL 5 MG/ML IJ SOLN
10.0000 mg | Freq: Once | INTRAMUSCULAR | Status: AC
  Administered 2022-03-18: 10 mg via INTRAVENOUS
  Filled 2022-03-18: qty 2

## 2022-03-18 NOTE — ED Triage Notes (Signed)
Pt arrived POV from home c/o blurred vision/ it being very dark and difficult to see out of her right eye. Pt states it started last friday when she was flying home from [place].

## 2022-03-18 NOTE — Consult Note (Signed)
Ophthalmology Consult Note   Subjective: Patient is a 36 year old female who reports decrease vision in the right eye ongoing for about a week. Reports worse when going outside, some kaleidoscope vision, and pain with eye movement. It worsened about 3-4 days ago. She did go to [facility] but only got glasses prescription.  Objective: Vital signs in last 24 hours: Temp:  [99.1 F (37.3 C)] 99.1 F (37.3 C) (07/07 1423) Pulse Rate:  [76-82] 76 (07/07 1727) Resp:  [16-18] 16 (07/07 1727) BP: (119-137)/(74-98) 119/74 (07/07 1727) SpO2:  [100 %] 100 % (07/07 1727) Weight:  [89.8 kg] 89.8 kg (07/07 1152) Weight change:     Intake/Output from previous day: No intake/output data recorded. Intake/Output this shift: No intake/output data recorded.  Base Eye Exam  Visual Acuity (ETDRS)   Right Left  Dist [place] CF 20/20  Dist ph [place]     Tonometry (Tonopen)   Right Left  Pressure 10 11   Pupils   APD  Right None  Left None   Visual Fields   Right Left    Full   Extraocular Movement   Right Left   Full Full   Neuro/Psych  Oriented x3: Yes  Mood/Affect: Normal         Slit Lamp and Fundus Exam   External Exam   Right Left  External Normal Normal    Slit Lamp Exam   Right Left  Lids/Lashes Normal Normal  Conjunctiva/Sclera White and quiet White and quiet  Cornea Clear Clear  Anterior Chamber Deep and quiet Deep and quiet  Iris Grossly normal Grossly normal  Lens         Fundus Exam   Right Clear  Posterior Vitreous Clear Clear  Disc Pink, sharp margins Pink, sharp margins  C/D Ratio 0.25 0.25  Macula Flat Flat  Vessels Normal course and caliber Normal course and caliber  Periphery Attached Attached     No results for input(s): "WBC", "HGB", "HCT", "NA", "K", "CL", "CO2", "BUN", "CREATININE", "GLU" in the last 72 hours.  Invalid input(s): "PLATELETS"  Studies/Results: No results found.   Assessment/Plan:  Decreased vision, right  eye -On exam patient has a significant APD and decreased vision. Reports pain with eye movements. Denies any autoimmune disease. Normal fundus exam. Suspect optic neuritis. Recommend MRI head and spine. Consider neurology consult. Patient on Humira, there have been case reports of optic neuritis caused by Humira. She is on Humira for HS, may need to consider stopping the medication. Recommend IV steroids if MRI confirms diagnosis. If MRI negative, recommend CBC, ESR, ACE level, Lyme antibody, FTA-ABS or treponemal-specific assay and RPR or VDRL tests, and chest x-ray or CT. Will follow along if patient is admitted.   LOS: 0 days   Jarin Cornfield T Leul Narramore 03/18/2022

## 2022-03-18 NOTE — Hospital Course (Signed)
Annette Benitez is a 36 y.o. female with medical history significant for hidradenitis suppurativa on Humira who is admitted with decreased vision in the right eye concerning for optic neuritis.  Started on 5-day course of IV steroids per neurology recommendations.

## 2022-03-18 NOTE — Assessment & Plan Note (Signed)
>>  ASSESSMENT AND PLAN FOR DECREASED VISION OF RIGHT EYE WRITTEN ON 03/18/2022 10:33 PM BY PATEL, Floreen Comber, MD  Concern is for optic neuritis.  Possibly related to Humira.  MRI brain, orbits, and C-spine are negative for acute abnormality or demyelinating disease. -Ophthalmology and neurology following -Started on high-dose IV Solu-Medrol 1000 mg daily x5 days per neurology recommendations -Follow ESR, RPR, ACE, Lyme, treponemal studies

## 2022-03-18 NOTE — Assessment & Plan Note (Signed)
Hold Humira.

## 2022-03-18 NOTE — ED Provider Notes (Signed)
[facility] EMERGENCY DEPARTMENT Provider Note   CSN: [id number] Arrival date & time: 03/18/22  1040     History  Chief Complaint  Patient presents with   Blurred Vision    Annette Benitez is a 36 y.o. female.  Pt is a 36 yo female with a pmhx significant for anemia and arthritis.  Pt said she was flying home from [place] on 6/30.  She developed blurred vision in the right eye.  She said the blurry vision is worsening.  She does have some pain in her right eye.  She did see an optometrist on 7/3 and was told she needs glasses.  She has ordered the glasses, but they are not ready yet.  Pt is unsure if they took a picture of her retina or not.  Pt did try to get an appt with her pcp, but was unable to get one until September.  Pt denies any other neurological problems.       Home Medications Prior to Admission medications   Medication Sig Start Date End Date Taking? Authorizing Provider  HUMIRA PEN 40 MG/0.8ML PNKT Inject 40 Units into the skin once a week. 02/11/22  Yes [provider]  Vitamin D, Ergocalciferol, (DRISDOL) 1.25 MG (50000 UNIT) CAPS capsule Take 1 capsule (50,000 Units total) by mouth every 7 (seven) days. 01/05/22  Yes Nche, Charlene Brooke, NP  Adalimumab 40 MG/0.8ML PSKT Inject into the skin.    [provider]  etonogestrel-ethinyl estradiol (NUVARING) 0.12-0.015 MG/24HR vaginal ring Insert vaginally and leave in place for 3 consecutive weeks, remove and leave out for 1week, then reinsert new ring 01/03/22   Nche, Charlene Brooke, NP      Allergies    Patient has no known allergies.    Review of Systems   Review of Systems  Eyes:  Positive for visual disturbance.  All other systems reviewed and are negative.   Physical Exam Updated Vital Signs BP 126/82   Pulse 71   Temp 99.1 F (37.3 C) (Oral)   Resp 18   Ht '5\' 7"'  (1.702 m)   Wt 89.8 kg   SpO2 100%   BMI 31.01 kg/m  Physical Exam Vitals and nursing note reviewed.   Constitutional:      Appearance: Normal appearance.  HENT:     Head: Normocephalic and atraumatic.     Right Ear: External ear normal.     Left Ear: External ear normal.     Nose: Nose normal.     Mouth/Throat:     Mouth: Mucous membranes are moist.     Pharynx: Oropharynx is clear.  Eyes:     Extraocular Movements: Extraocular movements intact.     Conjunctiva/sclera: Conjunctivae normal.     Pupils: Pupils are equal, round, and reactive to light.     Comments: +APD  Cardiovascular:     Rate and Rhythm: Normal rate and regular rhythm.     Pulses: Normal pulses.     Heart sounds: Normal heart sounds.  Pulmonary:     Effort: Pulmonary effort is normal.     Breath sounds: Normal breath sounds.  Abdominal:     General: Abdomen is flat. Bowel sounds are normal.     Palpations: Abdomen is soft.  Musculoskeletal:        General: Normal range of motion.     Cervical back: Normal range of motion and neck supple.  Skin:    General: Skin is warm.  Capillary Refill: Capillary refill takes less than 2 seconds.  Neurological:     General: No focal deficit present.     Mental Status: She is alert and oriented to person, place, and time.  Psychiatric:        Mood and Affect: Mood normal.        Behavior: Behavior normal.     ED Results / Procedures / Treatments   Labs (all labs ordered are listed, but only abnormal results are displayed) Labs Reviewed  BASIC METABOLIC PANEL - Abnormal; Notable for the following components:      Result Value   Potassium 5.5 (*)    CO2 20 (*)    Calcium 8.3 (*)    All other components within normal limits  CBC WITH DIFFERENTIAL/PLATELET - Abnormal; Notable for the following components:   WBC 13.1 (*)    RBC 3.62 (*)    Hemoglobin 11.0 (*)    HCT 34.3 (*)    Lymphs Abs 5.3 (*)    All other components within normal limits  SEDIMENTATION RATE  ANGIOTENSIN CONVERTING ENZYME  LYME DISEASE SEROLOGY W/REFLEX  FLUORESCENT TREPONEMAL  AB(FTA)-IGG-BLD  RPR  VDRL, CSF  POTASSIUM    EKG None  Radiology DG Chest Portable 1 View  Result Date: 03/18/2022 CLINICAL DATA:  Altered mental status. EXAM: PORTABLE CHEST 1 VIEW COMPARISON:  Chest x-ray dated October 13, 2020. FINDINGS: The heart size and mediastinal contours are within normal limits. Both lungs are clear. The visualized skeletal structures are unremarkable. IMPRESSION: No active disease. Electronically Signed   By: Titus Dubin M.D.   On: 03/18/2022 21:32   MR BRAIN W WO CONTRAST  Result Date: 03/18/2022 CLINICAL DATA:  Optic neuritis suspected EXAM: MRI HEAD AND ORBITS WITHOUT AND WITH CONTRAST TECHNIQUE: Multiplanar, multiecho pulse sequences of the brain and surrounding structures were obtained without and with intravenous contrast. Multiplanar, multiecho pulse sequences of the orbits and surrounding structures were obtained including fat saturation techniques, before and after intravenous contrast administration. CONTRAST:  8.61m GADAVIST GADOBUTROL 1 MMOL/ML IV SOLN COMPARISON:  None Available. FINDINGS: MRI HEAD FINDINGS Brain: No acute infarct, mass effect or extra-axial collection. No acute or chronic hemorrhage. Normal white matter signal, parenchymal volume and CSF spaces. The midline structures are normal. Vascular: Major flow voids are preserved. Skull and upper cervical spine: Normal calvarium and skull base. Visualized upper cervical spine and soft tissues are normal. Sinuses/Orbits:No paranasal sinus fluid levels or advanced mucosal thickening. No mastoid or middle ear effusion. Normal orbits. MRI ORBITS FINDINGS Orbits: No traumatic or inflammatory finding. Globes, optic nerves, orbital fat, extraocular muscles, vascular structures, and lacrimal glands are normal. Visualized sinuses: Clear. Soft tissues: Negative. IMPRESSION: Normal MRI of the brain and orbits. Electronically Signed   By: KUlyses JarredM.D.   On: 03/18/2022 20:34   MR ORBITS W WO  CONTRAST  Result Date: 03/18/2022 CLINICAL DATA:  Optic neuritis suspected EXAM: MRI HEAD AND ORBITS WITHOUT AND WITH CONTRAST TECHNIQUE: Multiplanar, multiecho pulse sequences of the brain and surrounding structures were obtained without and with intravenous contrast. Multiplanar, multiecho pulse sequences of the orbits and surrounding structures were obtained including fat saturation techniques, before and after intravenous contrast administration. CONTRAST:  8.534mGADAVIST GADOBUTROL 1 MMOL/ML IV SOLN COMPARISON:  None Available. FINDINGS: MRI HEAD FINDINGS Brain: No acute infarct, mass effect or extra-axial collection. No acute or chronic hemorrhage. Normal white matter signal, parenchymal volume and CSF spaces. The midline structures are normal. Vascular: Major flow voids are preserved.  Skull and upper cervical spine: Normal calvarium and skull base. Visualized upper cervical spine and soft tissues are normal. Sinuses/Orbits:No paranasal sinus fluid levels or advanced mucosal thickening. No mastoid or middle ear effusion. Normal orbits. MRI ORBITS FINDINGS Orbits: No traumatic or inflammatory finding. Globes, optic nerves, orbital fat, extraocular muscles, vascular structures, and lacrimal glands are normal. Visualized sinuses: Clear. Soft tissues: Negative. IMPRESSION: Normal MRI of the brain and orbits. Electronically Signed   By: Ulyses Jarred M.D.   On: 03/18/2022 20:34   MR CERVICAL SPINE W WO CONTRAST  Result Date: 03/18/2022 CLINICAL DATA:  Right eye vision changes for the past week. EXAM: MRI CERVICAL SPINE WITHOUT AND WITH CONTRAST TECHNIQUE: Multiplanar and multiecho pulse sequences of the cervical spine, to include the craniocervical junction and cervicothoracic junction, were obtained without and with intravenous contrast. CONTRAST:  8.23m GADAVIST GADOBUTROL 1 MMOL/ML IV SOLN COMPARISON:  None Available. FINDINGS: Alignment: Straightening of the normal cervical lordosis. No listhesis.  Vertebrae: No fracture, evidence of discitis, or bone lesion. Cord: Normal signal and morphology. No abnormal intrathecal enhancement. Posterior Fossa, vertebral arteries, paraspinal tissues: Negative. Disc levels: No significant disc bulge or herniation.  No stenosis. IMPRESSION: 1. No acute abnormality.  No evidence of demyelinating disease. Electronically Signed   By: WTitus DubinM.D.   On: 03/18/2022 20:31    Procedures Procedures    Medications Ordered in ED Medications  methylPREDNISolone sodium succinate (SOLU-MEDROL) 1,000 mg in sodium chloride 0.9 % 50 mL IVPB (has no administration in time range)  tetracaine (PONTOCAINE) 0.5 % ophthalmic solution 1 drop (1 drop Right Eye Given 03/18/22 1655)  fluorescein ophthalmic strip 1 strip (1 strip Right Eye Given 03/18/22 1656)  sodium chloride 0.9 % bolus 1,000 mL (1,000 mLs Intravenous Bolus 03/18/22 1701)  ketorolac (TORADOL) 30 MG/ML injection 30 mg (30 mg Intravenous Given 03/18/22 1702)  metoCLOPramide (REGLAN) injection 10 mg (10 mg Intravenous Given 03/18/22 1706)  diphenhydrAMINE (BENADRYL) injection 12.5 mg (12.5 mg Intravenous Given 03/18/22 1704)  gadobutrol (GADAVIST) 1 MMOL/ML injection 8.5 mL (8.5 mLs Intravenous Contrast Given 03/18/22 1957)    ED Course/ Medical Decision Making/ A&P                           Medical Decision Making Amount and/or Complexity of Data Reviewed Labs: ordered. Radiology: ordered.  Risk Prescription drug management. Decision regarding hospitalization.   This patient presents to the ED for concern of visual disturbance, this involves an extensive number of treatment options, and is a complaint that carries with it a high risk of complications and morbidity.  The differential diagnosis includes retinal detachment, myopia, ocular migraine, vitreous hemorrhage, optic neuritis   Co morbidities that complicate the patient evaluation  anemia and arthritis   Additional history obtained:  Additional  history obtained from epic chart review External records from outside source obtained and reviewed including husband   Lab Tests:  I Ordered, and personally interpreted labs.  The pertinent results include:  elevated wbc at 13.1, hgb 11.0, bmp with K elevated at 5 (hemolyzed)   Imaging Studies ordered:  I ordered imaging studies including MRI  I independently visualized and interpreted imaging which showed  MRI brain and orbits: IMPRESSION:  Normal MRI of the brain and orbits.  MRI cervical spine: IMPRESSION:  1. No acute abnormality.  No evidence of demyelinating disease.  CXR: IMPRESSION:  No active disease.   I agree with the radiologist interpretation   Cardiac  Monitoring:  The patient was maintained on a cardiac monitor.  I personally viewed and interpreted the cardiac monitored which showed an underlying rhythm of: nsr   Medicines ordered and prescription drug management:  I ordered medication including toradol, benadryl, reglan  for migraine; solumedrol for optic neuritis  Reevaluation of the patient after these medicines showed that the patient improved I have reviewed the patients home medicines and have made adjustments as needed   Test Considered:  mri   Critical Interventions:  Mri/steroids   Consultations Obtained:  I requested consultation with the ophthalmologist (Dr. Lucianne Lei),  and discussed lab and imaging findings as well as pertinent plan - she will see pt in consult. She recommends: Decreased vision, right eye -On exam patient has a significant APD and decreased vision. Reports pain with eye movements. Denies any autoimmune disease. Normal fundus exam. Suspect optic neuritis. Recommend MRI head and spine. Consider neurology consult. Patient on Humira, there have been case reports of optic neuritis caused by Humira. She is on Humira for HS, may need to consider stopping the medication. Recommend IV steroids if MRI confirms diagnosis. If MRI  negative, recommend CBC, ESR, ACE level, Lyme antibody, FTA-ABS or treponemal-specific assay and RPR or VDRL tests, and chest x-ray or CT. Will follow along if patient is admitted. I also spoke with Dr. Leonel Ramsay (neurology).  He recommends iv steroids (1g for 5 days) and admission. Pt d/w Dr. Posey Pronto (triad) for admission.   Problem List / ED Course:  Painful vision loss right eye:  optic neuritis.  IV solumedrol started.  Pt will need admission.   Reevaluation:  After the interventions noted above, I reevaluated the patient and found that they have :improved   Social Determinants of Health:  Lives at home   Dispostion:  After consideration of the diagnostic results and the patients response to treatment, I feel that the patent would benefit from admission.    CRITICAL CARE Performed by: Isla Pence   Total critical care time: 30 minutes  Critical care time was exclusive of separately billable procedures and treating other patients.  Critical care was necessary to treat or prevent imminent or life-threatening deterioration.  Critical care was time spent personally by me on the following activities: development of treatment plan with patient and/or surrogate as well as nursing, discussions with consultants, evaluation of patient's response to treatment, examination of patient, obtaining history from patient or surrogate, ordering and performing treatments and interventions, ordering and review of laboratory studies, ordering and review of radiographic studies, pulse oximetry and re-evaluation of patient's condition.         Final Clinical Impression(s) / ED Diagnoses Final diagnoses:  Optic neuritis, right    Rx / DC Orders ED Discharge Orders     None         Isla Pence, MD 03/18/22 2140

## 2022-03-18 NOTE — ED Notes (Signed)
Left eye 20/20 Right eye 20/160- patient stated she couldn't see any letters on the visual acuity and it was all blurry

## 2022-03-18 NOTE — H&P (Signed)
History and Physical    Annette Benitez QVZ:[id number] DOB: [birth year] DOA: 03/18/2022  PCP: Flossie Buffy, NP  Patient coming from: Home  I have personally briefly reviewed patient's old medical records in [place]  Chief Complaint: Decreased vision and pain of right eye  HPI: Annette Benitez is a 36 y.o. female with medical history significant for hidradenitis suppurativa on Humira who presented to the ED for evaluation of decreased vision and pain of the right eye.  Patient states that she flew back from [place] on 03/11/2022.  Afterwards she noticed new blurry vision affecting her whole right visual field.  She was having associated right eye pain with eye movement.  She had mild headache.  Initially she thought the symptoms would resolve on their own however they persisted.  She was unable to be seen in ophthalmology clinic.  She went to [facility] and was given prescription for new eyeglasses.  Symptoms however persisted and worsened over the last 3-4 days therefore she came to the ED for further evaluation.  She is taking Humira for hidradenitis and has been taking it for a few years now.  Normally takes it weekly but has not taken it the last 2 weeks.  ED Course  Labs/Imaging on admission: I have personally reviewed following labs and imaging studies.  Initial vitals showed BP 137/98, pulse 82, RR 16, temp 99.1 F, SPO2 100% on room air.  Labs show WBC 13.1, hemoglobin 11.0, platelets 279,000, sodium 138, potassium 5.5 (hemolysis noted and repeat lab pending), bicarb 20, BUN 10, creatinine 0.66, serum glucose 89.  Ophthalmology were consulted and evaluated patient.  Noted to have significant a fair pupillary defect and decreased vision of right eye.  Optic neuritis was suspected.  Further imaging and neurology consult was recommended.  MRI brain and orbits were normal studies.  MRI cervical spine negative for acute abnormality or evidence of demyelinating  disease.  Neurology were consulted and recommended high-dose steroids with IV Solu-Medrol 1000 mg x 5 days.  Patient received IV Toradol 30 mg, IV Reglan 10 mg, 1 L normal saline, IV Benadryl 12.5 mg while in the ED.  The hospitalist service was consulted to admit for further evaluation and management.  Review of Systems: All systems reviewed and are negative except as documented in history of present illness above.   Past Medical History:  Diagnosis Date   Hidradenitis    Iron deficiency anemia due to chronic blood loss 01/05/2022    Past Surgical History:  Procedure Laterality Date   NO PAST SURGERIES      Social History:  reports that she has been smoking cigarettes. She has a 3.75 pack-year smoking history. She has never used smokeless tobacco. She reports current alcohol use of about 6.0 standard drinks of alcohol per week. She reports that she does not use drugs.  No Known Allergies  Family History  Problem Relation Age of Onset   Hypertension Father    Obesity Mother    Varicose Veins Mother    ADD / ADHD Son      Prior to Admission medications   Medication Sig Start Date End Date Taking? Authorizing Provider  Adalimumab 40 MG/0.8ML PSKT Inject into the skin.    [provider]  etonogestrel-ethinyl estradiol (NUVARING) 0.12-0.015 MG/24HR vaginal ring Insert vaginally and leave in place for 3 consecutive weeks, remove and leave out for 1week, then reinsert new ring 01/03/22   Nche, Charlene Brooke, NP  Vitamin D, Ergocalciferol, (DRISDOL) 1.25 MG (  50000 UNIT) CAPS capsule Take 1 capsule (50,000 Units total) by mouth every 7 (seven) days. 01/05/22   NcheCharlene Brooke, NP    Physical Exam: Vitals:   03/18/22 1745 03/18/22 1800 03/18/22 1815 03/18/22 1830  BP: 116/72 130/78 123/79 126/82  Pulse: 72 67 75 71  Resp:    18  Temp:      TempSrc:      SpO2: 99% 100% 100% 100%  Weight:      Height:       Constitutional: Sitting up in bed, NAD, calm,  comfortable Eyes: Pupils are dilated, afferent pupillary defect noted right eye.  EOMI intact with rightward nystagmus.  Lids and conjunctivae normal ENMT: Mucous membranes are moist. Posterior pharynx clear of any exudate or lesions.Normal dentition.  Neck: normal, supple, no masses. Respiratory: clear to auscultation bilaterally, no wheezing, no crackles. Normal respiratory effort. No accessory muscle use.  Cardiovascular: Regular rate and rhythm, no murmurs / rubs / gallops. No extremity edema. 2+ pedal pulses. Abdomen: no tenderness, no masses palpated. No hepatosplenomegaly. Bowel sounds positive.  Musculoskeletal: no clubbing / cyanosis. No joint deformity upper and lower extremities. Good ROM, no contractures. Normal muscle tone.  Skin: no rashes, lesions, ulcers. No induration Neurologic: Sensation intact. Strength 5/5 in all 4.  Psychiatric: Normal judgment and insight. Alert and oriented x 3. Normal mood.   EKG: Not performed.  Assessment/Plan Principal Problem:   Decreased vision of right eye Active Problems:   Hidradenitis suppurativa   Annette Benitez is a 36 y.o. female with medical history significant for hidradenitis suppurativa on Humira who is admitted with decreased vision in the right eye concerning for optic neuritis.  Started on 5-day course of IV steroids per neurology recommendations.  Assessment and Plan: * Decreased vision of right eye Concern is for optic neuritis.  Possibly related to Humira.  MRI brain, orbits, and C-spine are negative for acute abnormality or demyelinating disease. -Ophthalmology and neurology following -Started on high-dose IV Solu-Medrol 1000 mg daily x5 days per neurology recommendations -Follow ESR, RPR, ACE, Lyme, treponemal studies  Hidradenitis suppurativa Hold Humira.  DVT prophylaxis: enoxaparin (LOVENOX) injection 40 mg Start: 03/18/22 2230 Code Status: Full code, confirmed on admission Family Communication: Discussed with  husband at bedside Disposition Plan: From home and likely discharge to home after completion of 5-day high-dose IV steroid course Consults called: Ophthalmology, neurology Severity of Illness: The appropriate patient status for this patient is INPATIENT. Inpatient status is judged to be reasonable and necessary in order to provide the required intensity of service to ensure the patient's safety. The patient's presenting symptoms, physical exam findings, and initial radiographic and laboratory data in the context of their chronic comorbidities is felt to place them at high risk for further clinical deterioration. Furthermore, it is not anticipated that the patient will be medically stable for discharge from the hospital within 2 midnights of admission.   * I certify that at the point of admission it is my clinical judgment that the patient will require inpatient hospital care spanning beyond 2 midnights from the point of admission due to high intensity of service, high risk for further deterioration and high frequency of surveillance required.Zada Finders MD [facility]  If 7PM-7AM, please contact night-coverage [email or web address]  03/18/2022, 10:35 PM

## 2022-03-18 NOTE — ED Notes (Signed)
Eye dr at the bedside

## 2022-03-18 NOTE — ED Provider Triage Note (Signed)
Emergency Medicine Provider Triage Evaluation Note  Annette Benitez , a 36 y.o. female  was evaluated in triage.  Pt complains of vision changes. Report monocular R vision changes x 1 week after flying home from [place].  Mild headache, no diplopia, no cold sxs  Review of Systems  Positive: As above Negative: As above  Physical Exam  BP (!) 137/98 (BP Location: Right Arm)   Pulse 82   Temp 99.1 F (37.3 C) (Oral)   Resp 16   Ht 5\' 7"  (1.702 m)   Wt 89.8 kg   SpO2 100%   BMI 31.01 kg/m  Gen:   Awake, no distress   Resp:  Normal effort  MSK:   Moves extremities without difficulty  Other:    Medical Decision Making  Medically screening exam initiated at 12:00 PM.  Appropriate orders placed.  Annette Benitez was informed that the remainder of the evaluation will be completed by another provider, this initial triage assessment does not replace that evaluation, and the importance of remaining in the ED until their evaluation is complete.     Christianne Dolin, PA-C 03/18/22 1201

## 2022-03-18 NOTE — Assessment & Plan Note (Signed)
Concern is for optic neuritis.  Possibly related to Humira.  MRI brain, orbits, and C-spine are negative for acute abnormality or demyelinating disease. -Ophthalmology and neurology following -Started on high-dose IV Solu-Medrol 1000 mg daily x5 days per neurology recommendations -Follow ESR, RPR, ACE, Lyme, treponemal studies

## 2022-03-19 DIAGNOSIS — L732 Hidradenitis suppurativa: Secondary | ICD-10-CM | POA: Diagnosis not present

## 2022-03-19 LAB — HEMOGLOBIN A1C
Hgb A1c MFr Bld: 5.6 % (ref 4.8–5.6)
Mean Plasma Glucose: 114.02 mg/dL

## 2022-03-19 LAB — MAGNESIUM: Magnesium: 1.9 mg/dL (ref 1.7–2.4)

## 2022-03-19 LAB — GLUCOSE, CAPILLARY
Glucose-Capillary: 172 mg/dL — ABNORMAL HIGH (ref 70–99)
Glucose-Capillary: 156 mg/dL — ABNORMAL HIGH (ref 70–99)
Glucose-Capillary: 168 mg/dL — ABNORMAL HIGH (ref 70–99)
Glucose-Capillary: 144 mg/dL — ABNORMAL HIGH (ref 70–99)

## 2022-03-19 LAB — CBC
HCT: 33.9 % — ABNORMAL LOW (ref 36.0–46.0)
Hemoglobin: 11.4 g/dL — ABNORMAL LOW (ref 12.0–15.0)
MCH: 30.9 pg (ref 26.0–34.0)
MCHC: 33.6 g/dL (ref 30.0–36.0)
MCV: 91.9 fL (ref 80.0–100.0)
Platelets: 281 10*3/uL (ref 150–400)
RBC: 3.69 MIL/uL — ABNORMAL LOW (ref 3.87–5.11)
RDW: 12.9 % (ref 11.5–15.5)
WBC: 10.9 10*3/uL — ABNORMAL HIGH (ref 4.0–10.5)
nRBC: 0 % (ref 0.0–0.2)

## 2022-03-19 LAB — BASIC METABOLIC PANEL
Anion gap: 11 (ref 5–15)
BUN: 12 mg/dL (ref 6–20)
CO2: 18 mmol/L — ABNORMAL LOW (ref 22–32)
Calcium: 9.1 mg/dL (ref 8.9–10.3)
Chloride: 110 mmol/L (ref 98–111)
Creatinine, Ser: 0.84 mg/dL (ref 0.44–1.00)
GFR, Estimated: >60 mL/min (ref >60)
Glucose, Bld: 197 mg/dL — ABNORMAL HIGH (ref 70–99)
Potassium: 4.1 mmol/L (ref 3.5–5.1)
Sodium: 139 mmol/L (ref 135–145)

## 2022-03-19 LAB — RPR: RPR Ser Ql: NONREACTIVE

## 2022-03-19 LAB — HIV ANTIBODY (ROUTINE TESTING W REFLEX): HIV Screen 4th Generation wRfx: NONREACTIVE

## 2022-03-19 MED ORDER — PANTOPRAZOLE SODIUM 40 MG PO TBEC
40.0000 mg | DELAYED_RELEASE_TABLET | Freq: Every day | ORAL | Status: DC
  Administered 2022-03-19 – 2022-03-24 (×6): 40 mg via ORAL
  Filled 2022-03-19 (×6): qty 1

## 2022-03-19 MED ORDER — INSULIN ASPART 100 UNIT/ML IJ SOLN
0.0000 [IU] | Freq: Three times a day (TID) | INTRAMUSCULAR | Status: DC
  Administered 2022-03-19 – 2022-03-20 (×2): 2 [IU] via SUBCUTANEOUS
  Administered 2022-03-20 – 2022-03-21 (×2): 1 [IU] via SUBCUTANEOUS
  Administered 2022-03-21: 2 [IU] via SUBCUTANEOUS
  Administered 2022-03-22 (×2): 1 [IU] via SUBCUTANEOUS
  Administered 2022-03-22: 2 [IU] via SUBCUTANEOUS
  Administered 2022-03-23: 1 [IU] via SUBCUTANEOUS
  Administered 2022-03-23: 2 [IU] via SUBCUTANEOUS
  Administered 2022-03-23 – 2022-03-24 (×2): 1 [IU] via SUBCUTANEOUS

## 2022-03-19 MED ORDER — IBUPROFEN 400 MG PO TABS
400.0000 mg | ORAL_TABLET | Freq: Four times a day (QID) | ORAL | Status: DC | PRN
  Administered 2022-03-19 – 2022-03-24 (×6): 400 mg via ORAL
  Filled 2022-03-19 (×7): qty 1

## 2022-03-19 NOTE — Plan of Care (Signed)
  Problem: Education: Goal: Knowledge of General Education information will improve Description: Including pain rating scale, medication(s)/side effects and non-pharmacologic comfort measures Outcome: Completed/Met

## 2022-03-19 NOTE — Progress Notes (Signed)
[facility] Progress Note  Patient: Annette Benitez     GGE:[id number]  DOA: 03/18/2022   PCP: Flossie Buffy, NP       Brief hospital course: This is a 36 year old female with a history of hidradenitis suppurativa who is on Humira and presented to the ED for blurred vision in the right eye which started last Friday and became increasingly severe. She was evaluated by neurology and ophthalmology the ED.  She was noted by ophthalmology to have an afferent pupillary defect and decreased vision in the right eye.  An MRI was negative.  Neurology placed her on IV Solu-Medrol 1 g daily for 5 days. Subjective:  No change in vision. Assessment and Plan: Principal Problem:   Decreased vision of right eye -Continue 1 g Solu-Medrol daily x5 days per neurology -Start low dose NovoLog sliding scale in case glucose is high from Solu-Medrol - RPR is nonreactive, ESR is 24, HIV nonreactive, A1c 5.5 - ACE level, Lyme disease serology, fluorescent treponemal antibody, ANA with reflex, neuromyelitis optica autoantibody , antiphospholipid antibody pending  Active Problems:   Hidradenitis suppurativa Receives Humira once a week as outpatient  DVT prophylaxis:  enoxaparin (LOVENOX) injection 40 mg Start: 03/19/22 1000     Code Status: Full Code  Consultants: Ophthalmology, neurology Level of Care: Level of care: Med-Surg Disposition Plan:  Status is: Inpatient Remains inpatient appropriate because: Receiving IV Solu-Medrol-last doses 03/23/2022  Objective:   Vitals:   03/18/22 2300 03/19/22 0053 03/19/22 0544 03/19/22 0932  BP: 136/87 134/90 134/82 (!) 138/93  Pulse: 88 93 81 95  Resp:  '18 18 18  ' Temp:  97.9 F (36.6 C) 98.3 F (36.8 C) 98.1 F (36.7 C)  TempSrc:  Oral Oral   SpO2: 100% 98% 100% 99%  Weight:      Height:       Filed Weights   03/18/22 1152  Weight: 89.8 kg   Exam: General exam: Appears comfortable  HEENT: PERRLA, oral mucosa moist, no sclera icterus or  thrush Respiratory system: Clear to auscultation. Respiratory effort normal. Cardiovascular system: S1 & S2 heard, regular rate and rhythm Gastrointestinal system: Abdomen soft, non-tender, nondistended. Normal bowel sounds   Central nervous system: Alert and oriented. No focal neurological deficits.-Vision not checked Extremities: No cyanosis, clubbing or edema Skin: No rashes or ulcers Psychiatry:  Mood & affect appropriate.    Imaging and lab data was personally reviewed    CBC: Recent Labs  Lab 03/18/22 1842 03/19/22 0256  WBC 13.1* 10.9*  NEUTROABS 6.9  --   HGB 11.0* 11.4*  HCT 34.3* 33.9*  MCV 94.8 91.9  PLT 279 465   Basic Metabolic Panel: Recent Labs  Lab 03/18/22 1842 03/18/22 2200 03/19/22 0256  NA 138  --  139  K 5.5* 3.4* 4.1  CL 109  --  110  CO2 20*  --  18*  GLUCOSE 89  --  197*  BUN 10  --  12  CREATININE 0.66  --  0.84  CALCIUM 8.3*  --  9.1  MG  --   --  1.9   GFR: Estimated Creatinine Clearance: 105.5 mL/min (by C-G formula based on SCr of 0.84 mg/dL).  Scheduled Meds:  enoxaparin (LOVENOX) injection  40 mg Subcutaneous Q24H   insulin aspart  0-9 Units Subcutaneous TID WC   pantoprazole  40 mg Oral Daily   Continuous Infusions:  methylPREDNISolone (SOLU-MEDROL) injection 1,000 mg (03/19/22 1111)     LOS: 1 day   Author:  Leilanie Rauda  03/19/2022 2:20 PM

## 2022-03-19 NOTE — Consult Note (Addendum)
Neurology Consultation Reason for Consult: Right eye visual change Referring Physician: Para Skeans  CC: Right eye visual change  History is obtained from: Patient  HPI: Annette Benitez is a 36 y.o. female with a history of hidradenitis suppurativa treated with Humira who presents with right eye worsening vision as well as pain with eye movement.  She was seen by ophthalmology who documented that she had an afferent pupillary defect, but she had been dilated at the time of my exam.  She states that last Friday she noticed that she started having some blurred vision of her right eye.  She states that over the week it has been getting progressively worse.    She did go to see an optometrist who gave her a new prescription but due to continued worsening she sought care in the emergency department.   Past Medical History:  Diagnosis Date   Hidradenitis    Iron deficiency anemia due to chronic blood loss 01/05/2022     Family History  Problem Relation Age of Onset   Hypertension Father    Obesity Mother    Varicose Veins Mother    ADD / ADHD Son      Social History:  reports that she has been smoking cigarettes. She has a 3.75 pack-year smoking history. She has never used smokeless tobacco. She reports current alcohol use of about 6.0 standard drinks of alcohol per week. She reports that she does not use drugs.   Exam: Current vital signs: BP 134/90 (BP Location: Left Arm)   Pulse 93   Temp 97.9 F (36.6 C) (Oral)   Resp 18   Ht 5\' 7"  (1.702 m)   Wt 89.8 kg   SpO2 98%   BMI 31.01 kg/m  Vital signs in last 24 hours: Temp:  [97.9 F (36.6 C)-99.1 F (37.3 C)] 97.9 F (36.6 C) (07/08 0053) Pulse Rate:  [64-93] 93 (07/08 0053) Resp:  [16-18] 18 (07/08 0053) BP: (116-137)/(72-98) 134/90 (07/08 0053) SpO2:  [98 %-100 %] 98 % (07/08 0053) Weight:  [89.8 kg] 89.8 kg (07/07 1152)   Physical Exam  Constitutional: Appears well-developed and well-nourished.  Psych: Affect  appropriate to situation Eyes: No scleral injection HENT: No OP obstruction MSK: no joint deformities.  Cardiovascular: Normal rate and regular rhythm.  Respiratory: Effort normal, non-labored breathing GI: Soft.  No distension. There is no tenderness.  Skin: WDI  Neuro: Mental Status: Patient is awake, alert, oriented to person, place, month, year, and situation. Patient is able to give a clear and coherent history. No signs of aphasia or neglect Cranial Nerves: II: Visual Fields are full. Pupils are iatrogenically dilated. III,IV, VI: EOMI without ptosis or diploplia.  V: Facial sensation is symmetric to temperature VII: Facial movement is symmetric.  VIII: hearing is intact to voice X: Uvula elevates symmetrically XI: Shoulder shrug is symmetric. XII: tongue is midline without atrophy or fasciculations.  Motor: Tone is normal. Bulk is normal. 5/5 strength was present in all four extremities.  Sensory: Sensation is symmetric to light touch and temperature in the arms and legs. Deep Tendon Reflexes: 2+ and symmetric in the biceps and patellae.  Plantars: Toes are downgoing bilaterally.  Cerebellar: FNF and HKS are intact bilaterally    I have reviewed labs in epic and the results pertinent to this consultation are: BMP-unremarkable  I have reviewed the images obtained: MRI brain, MRI orbits, MRI C-spine-negative  Impression: 36 year old female with progressive blurring of vision and pain with eye movement of the  right eye with APD documented by ophthalmology.  This constellation of symptoms is by far most consistent with optic neuritis and I would favor treating it as such.  MRIs 80 to 90% sensitive, but this still leaves 10 to 20% of patients with optic neuritis with negative MRI and therefore I would still favor IV Solu-Medrol given that there is objective evidence of some type of optic neuropathy (APD) as well as pain associated with it.  She has no evidence by MRI of  other demyelinating lesions, but MS would still be in the differential.  There are case reports ascribing episodes of optic neuritis and central nervous system demyelination to Humira, and given her lack of other findings, I do think I would favor changing from this agent.  Recommendations: 1) Solu-Medrol 1 g daily for 5 days 2) omeprazole 40 mg daily while on high-dose steroids 3) RPR, , ACE, ANA, anti-phospholipids, NMO, lyme 4) neurology will follow   Ritta Slot, MD [facility] [phone]  If 7pm- 7am, please page neurology on call as listed in AMION.

## 2022-03-19 NOTE — Progress Notes (Addendum)
Patient was admitted to the unit with her belongings at bedside. Patient denies pain. VS stable and MD contacted for placement.Patient understands call bell system and has been oriented to the room and unit.

## 2022-03-20 ENCOUNTER — Inpatient Hospital Stay (HOSPITAL_COMMUNITY): Payer: Federal, State, Local not specified - PPO

## 2022-03-20 DIAGNOSIS — G379 Demyelinating disease of central nervous system, unspecified: Secondary | ICD-10-CM | POA: Diagnosis not present

## 2022-03-20 DIAGNOSIS — H5461 Unqualified visual loss, right eye, normal vision left eye: Secondary | ICD-10-CM | POA: Diagnosis not present

## 2022-03-20 DIAGNOSIS — L732 Hidradenitis suppurativa: Secondary | ICD-10-CM | POA: Diagnosis not present

## 2022-03-20 LAB — GLUCOSE, CAPILLARY
Glucose-Capillary: 150 mg/dL — ABNORMAL HIGH (ref 70–99)
Glucose-Capillary: 146 mg/dL — ABNORMAL HIGH (ref 70–99)
Glucose-Capillary: 111 mg/dL — ABNORMAL HIGH (ref 70–99)
Glucose-Capillary: 174 mg/dL — ABNORMAL HIGH (ref 70–99)
Glucose-Capillary: 166 mg/dL — ABNORMAL HIGH (ref 70–99)

## 2022-03-20 LAB — ANGIOTENSIN CONVERTING ENZYME: Angiotensin-Converting Enzyme: 28 U/L (ref 14–82)

## 2022-03-20 MED ORDER — GADOBUTROL 1 MMOL/ML IV SOLN
9.0000 mL | Freq: Once | INTRAVENOUS | Status: AC | PRN
  Administered 2022-03-20: 9 mL via INTRAVENOUS

## 2022-03-20 MED ORDER — LORAZEPAM 2 MG/ML IJ SOLN
0.5000 mg | Freq: Once | INTRAMUSCULAR | Status: AC
  Administered 2022-03-20: 0.5 mg via INTRAVENOUS
  Filled 2022-03-20: qty 1

## 2022-03-20 NOTE — Progress Notes (Signed)
Ophthalmology Progress Note  Subjective: Patient states that she sees more color now.  Objective: Vital signs in last 24 hours: Temp:  [98.3 F (36.8 C)-99 F (37.2 C)] 98.6 F (37 C) (07/09 0909) Pulse Rate:  [86-98] 98 (07/09 0909) Resp:  [16-20] 18 (07/09 0909) BP: (128-141)/(84-91) 137/87 (07/09 0909) SpO2:  [93 %-99 %] 98 % (07/09 0909) Weight change:  Last BM Date : 03/17/22  Intake/Output from previous day: 07/08 0701 - 07/09 0700 In: 1016 [P.O.:950; IV Piggyback:66] Out: 0  Intake/Output this shift: Total I/O In: 595 [P.O.:595] Out: -   Base Eye Exam       Visual Acuity (ETDRS)     Right Left  Dist [place] HM   Dist ph [place]        Tonometry (Tonopen)     Right Left  Pressure 18 20   Pupils     APD  Right [place] APD  Left None    Visual Fields     Right Left      Full    Extraocular Movement     Right Left    Full Full    Neuro/Psych   Oriented x3: Yes  Mood/Affect: Normal    External Exam     Right Left  External Normal Normal     Slit Lamp Exam     Right Left  Lids/Lashes Normal Normal  Conjunctiva/Sclera White and quiet White and quiet  Cornea Clear Clear  Anterior Chamber Deep and quiet Deep and quiet  Iris Grossly normal Grossly normal  Lens  Clear  Clear           Fundus Exam     Right Left  Posterior Vitreous Clear   Disc Pink, sharp margins   C/D Ratio 0.25   Macula Flat   Vessels Normal course and caliber   Periphery Attached       Recent Labs    03/18/22 1842 03/18/22 2200 03/19/22 0256  WBC 13.1*  --  10.9*  HGB 11.0*  --  11.4*  HCT 34.3*  --  33.9*  NA 138  --  139  K 5.5* 3.4* 4.1  CL 109  --  110  CO2 20*  --  18*  BUN 10  --  12  CREATININE 0.66  --  0.84    Studies/Results: DG Chest Portable 1 View  Result Date: 03/18/2022 CLINICAL DATA:  Altered mental status. EXAM: PORTABLE CHEST 1 VIEW COMPARISON:  Chest x-ray dated October 13, 2020. FINDINGS: The heart size and mediastinal contours are  within normal limits. Both lungs are clear. The visualized skeletal structures are unremarkable. IMPRESSION: No active disease. Electronically Signed   By: Titus Dubin M.D.   On: 03/18/2022 21:32   MR BRAIN W WO CONTRAST  Result Date: 03/18/2022 CLINICAL DATA:  Optic neuritis suspected EXAM: MRI HEAD AND ORBITS WITHOUT AND WITH CONTRAST TECHNIQUE: Multiplanar, multiecho pulse sequences of the brain and surrounding structures were obtained without and with intravenous contrast. Multiplanar, multiecho pulse sequences of the orbits and surrounding structures were obtained including fat saturation techniques, before and after intravenous contrast administration. CONTRAST:  8.20m GADAVIST GADOBUTROL 1 MMOL/ML IV SOLN COMPARISON:  None Available. FINDINGS: MRI HEAD FINDINGS Brain: No acute infarct, mass effect or extra-axial collection. No acute or chronic hemorrhage. Normal white matter signal, parenchymal volume and CSF spaces. The midline structures are normal. Vascular: Major flow voids are preserved. Skull and upper cervical spine: Normal calvarium and skull base. Visualized upper  cervical spine and soft tissues are normal. Sinuses/Orbits:No paranasal sinus fluid levels or advanced mucosal thickening. No mastoid or middle ear effusion. Normal orbits. MRI ORBITS FINDINGS Orbits: No traumatic or inflammatory finding. Globes, optic nerves, orbital fat, extraocular muscles, vascular structures, and lacrimal glands are normal. Visualized sinuses: Clear. Soft tissues: Negative. IMPRESSION: Normal MRI of the brain and orbits. Electronically Signed   By: Ulyses Jarred M.D.   On: 03/18/2022 20:34   MR ORBITS W WO CONTRAST  Result Date: 03/18/2022 CLINICAL DATA:  Optic neuritis suspected EXAM: MRI HEAD AND ORBITS WITHOUT AND WITH CONTRAST TECHNIQUE: Multiplanar, multiecho pulse sequences of the brain and surrounding structures were obtained without and with intravenous contrast. Multiplanar, multiecho pulse sequences  of the orbits and surrounding structures were obtained including fat saturation techniques, before and after intravenous contrast administration. CONTRAST:  8.60m GADAVIST GADOBUTROL 1 MMOL/ML IV SOLN COMPARISON:  None Available. FINDINGS: MRI HEAD FINDINGS Brain: No acute infarct, mass effect or extra-axial collection. No acute or chronic hemorrhage. Normal white matter signal, parenchymal volume and CSF spaces. The midline structures are normal. Vascular: Major flow voids are preserved. Skull and upper cervical spine: Normal calvarium and skull base. Visualized upper cervical spine and soft tissues are normal. Sinuses/Orbits:No paranasal sinus fluid levels or advanced mucosal thickening. No mastoid or middle ear effusion. Normal orbits. MRI ORBITS FINDINGS Orbits: No traumatic or inflammatory finding. Globes, optic nerves, orbital fat, extraocular muscles, vascular structures, and lacrimal glands are normal. Visualized sinuses: Clear. Soft tissues: Negative. IMPRESSION: Normal MRI of the brain and orbits. Electronically Signed   By: KUlyses JarredM.D.   On: 03/18/2022 20:34   MR CERVICAL SPINE W WO CONTRAST  Result Date: 03/18/2022 CLINICAL DATA:  Right eye vision changes for the past week. EXAM: MRI CERVICAL SPINE WITHOUT AND WITH CONTRAST TECHNIQUE: Multiplanar and multiecho pulse sequences of the cervical spine, to include the craniocervical junction and cervicothoracic junction, were obtained without and with intravenous contrast. CONTRAST:  8.586mGADAVIST GADOBUTROL 1 MMOL/ML IV SOLN COMPARISON:  None Available. FINDINGS: Alignment: Straightening of the normal cervical lordosis. No listhesis. Vertebrae: No fracture, evidence of discitis, or bone lesion. Cord: Normal signal and morphology. No abnormal intrathecal enhancement. Posterior Fossa, vertebral arteries, paraspinal tissues: Negative. Disc levels: No significant disc bulge or herniation.  No stenosis. IMPRESSION: 1. No acute abnormality.  No evidence  of demyelinating disease. Electronically Signed   By: WiTitus Dubin.D.   On: 03/18/2022 20:31     Assessment/Plan:  Decreased vision, right eye -On exam patient has a significant APD and decreased vision. Reports pain with eye movements. Denies any autoimmune disease. Normal fundus exam. Suspect optic neuritis. MRI negative, agree with neurology to continue IV steroids for 5 days and work up.  Neurology getting MR thoracic spine to rule out any spinal involvement. CXR normal. RPR neg. Slight elevated ESR. Antiphospholipid, NMO, ANA, ACE, Lyme, FTA, pending. Will follow along, will check IOP tomorrow since trending up.    LOS: 2 days   Latania Bascomb T Tove Wideman 03/20/2022

## 2022-03-20 NOTE — Plan of Care (Signed)
  Problem: Clinical Measurements: Goal: Will remain free from infection Outcome: Completed/Met Goal: Diagnostic test results will improve Outcome: Completed/Met Goal: Respiratory complications will improve Outcome: Completed/Met Goal: Cardiovascular complication will be avoided Outcome: Completed/Met   

## 2022-03-20 NOTE — Progress Notes (Signed)
NEUROLOGY CONSULTATION PROGRESS NOTE   Date of service: March 20, 2022 Patient Name: Annette Benitez MRN:  [id number] DOB:  Jan 21, 1986  Brief HPI  Annette Benitez is a 36 y.o. female with progressive blurring of vision and pain with eye movement of the right eye with APD. This constellation of symptoms is by far most consistent with optic neuritis and favor treating it as such.  MRI brain/orbits are negative but MRIs are 80 to 90% sensitive, but this still leaves 10 to 20% of patients with optic neuritis with negative MRI and therefore still favor IV Solu-Medrol given that there is objective evidence of some type of optic neuropathy (APD) as well as pain associated with it.   Interval Hx   Feels clouding is clearing, was able to see the blue color of her nurses glove today which she reports was a bit of improvement. Can barely see the outline of my hand when held in front of her. Feels both central and peripheral vision are affected and central feels worse than peripheral althou both are significantly affected.  Pain in R eye is significantly improved.  Vitals   Vitals:   03/19/22 1632 03/19/22 2006 03/20/22 0424 03/20/22 0909  BP: 139/84 (!) 141/91 128/85 137/87  Pulse: 93 98 86 98  Resp: 18 20 16 18   Temp: 98.3 F (36.8 C) 99 F (37.2 C) 98.5 F (36.9 C) 98.6 F (37 C)  TempSrc: Oral  Oral Oral  SpO2: 98% 93% 99% 98%  Weight:      Height:         Body mass index is 31.01 kg/m.  Physical Exam   General: Laying comfortably in bed; in no acute distress.  HENT: Normal oropharynx and mucosa. Normal external appearance of ears and nose.  Neck: Supple, no pain or tenderness  CV: No JVD. No peripheral edema.  Pulmonary: Symmetric Chest rise. Normal respiratory effort.  Abdomen: Soft to touch, non-tender.  Ext: No cyanosis, edema, or deformity  Skin: No rash. Normal palpation of skin.   Musculoskeletal: Normal digits and nails by inspection. No clubbing.  Neurologic Examination   Mental status/Cognition: Alert, oriented to self, place, month and year, good attention.  Speech/language: Fluent, comprehension intact, object naming intact, repetition intact.  Cranial nerves:   CN II Pupils equal and reactive to light, R mono-ocular vision loss.   CN III,IV,VI EOM intact, no gaze preference or deviation, no nystagmus    CN V normal sensation in V1, V2, and V3 segments bilaterally    CN VII no asymmetry, no nasolabial fold flattening    CN VIII normal hearing to speech    CN IX & X normal palatal elevation, no uvular deviation    CN XI 5/5 head turn and 5/5 shoulder shrug bilaterally    CN XII midline tongue protrusion    Motor:  Muscle bulk: normal, tone normal, pronator drift none tremor none Mvmt Root Nerve  Muscle Right Left Comments  SA C5/6 Ax Deltoid 5 5   EF C5/6 Mc Biceps 5 5   EE C6/7/8 Rad Triceps 5 5   WF C6/7 Med FCR     WE C7/8 PIN ECU     F Ab C8/T1 U ADM/FDI 5 5   HF L1/2/3 Fem Illopsoas 5 5   KE L2/3/4 Fem Quad 5 5   DF L4/5 D Peron Tib Ant 5 5   PF S1/2 Tibial Grc/Sol 5 5    Reflexes:  Right Left Comments  Pectoralis  Biceps (C5/6) 2 2   Brachioradialis (C5/6) 2 2    Triceps (C6/7) 2 2    Patellar (L3/4) 2 2    Achilles (S1)      Hoffman      Plantar     Jaw jerk    Sensation:  Light touch Intact throughout   Pin prick    Temperature    Vibration   Proprioception    Coordination/Complex Motor:  - Finger to Nose intact BL - Heel to shin intact BL - Rapid alternating movement are normal - Gait: Deferred.  Labs   Basic Metabolic Panel:  Lab Results  Component Value Date   NA 139 03/19/2022   K 4.1 03/19/2022   CO2 18 (L) 03/19/2022   GLUCOSE 197 (H) 03/19/2022   BUN 12 03/19/2022   CREATININE 0.84 03/19/2022   CALCIUM 9.1 03/19/2022   GFRNONAA >60 03/19/2022   HbA1c:  Lab Results  Component Value Date   HGBA1C 5.6 03/19/2022   LDL:  Lab Results  Component Value Date   LDLCALC 141 (H) 01/03/2022   Urine  Drug Screen: No results found for: "LABOPIA", "COCAINSCRNUR", "LABBENZ", "AMPHETMU", "THCU", "LABBARB"  Alcohol Level No results found for: "ETH" No results found for: "PHENYTOIN", "ZONISAMIDE", "LAMOTRIGINE", "LEVETIRACETA" No results found for: "PHENYTOIN", "PHENOBARB", "VALPROATE", "CBMZ"  Imaging and Diagnostic studies   MRI brain with and without contrast: No acute intracranial abnormalities.  No demyelinating enhancing lesions.  MRI orbits with and without contrast: Normal.  No acute abnormalities.  MRI cervical spine without contrast: No acute abnormality.  No evidence of demyelinating disease.   Impression   Annette Benitez is a 36 y.o. female with progressive blurring of vision and pain with eye movement of the right eye with APD. Imaging in negative. This constellation of symptoms is by far most consistent with optic neuritis and favor treating it as such. MRIs 80 to 90% sensitive, but this still leaves 10 to 20% of patients with optic neuritis with negative MRI and therefore would still favor IV Solu-Medrol given that there is objective evidence of some type of optic neuropathy (APD) as well as pain associated with it.  She has no other demyelinating lesions on imaging and no prior episodes of focal neuro deficit that she can recall. At this time, she does not fit revised Mcdonald criteria for MS and thus does not have a diagnosis of Multiple sclerosis.  Her neurologic examination is notable for decreased visual acuity in R eye. No other deficit at this time.  Recommendations  - Plan for IVMP x 5 days. Today is day 3 and with no significant improvement, would consider extending to 7 days of IVMP. Any may consider PLEx if still significant vision deficit persists. - continue Protonix. - NMO panel, APLA, ANA, ACE, Lyme Ab pending - MOG ab send out ordered. - RPR negative - Recommend discontinuing Humira. Case reports of association with optic neuritis. - MRI T spine w + w/o C  ordered to evaluate for any demyelinating lesions in T spine. - We will continue to follow along.  ______________________________________________________________________   Thank you for the opportunity to take part in the care of this patient. If you have any further questions, please contact the neurology consultation attending.  Signed,  Erick Blinks [facility] Pager Number [phone]

## 2022-03-20 NOTE — Progress Notes (Addendum)
[facility] Progress Note  Patient: Annette Benitez     TGP:[id number]  DOA: 03/18/2022   PCP: Flossie Buffy, NP       Brief hospital course: This is a 36 year old female with a history of hidradenitis suppurativa who is on Humira and presented to the ED for blurred vision in the right eye which started last Friday and became increasingly severe. She was evaluated by neurology and ophthalmology the ED.  She was noted by ophthalmology to have an afferent pupillary defect and decreased vision in the right eye.  An MRI was negative.  Neurology placed her on IV Solu-Medrol 1 g daily for 5 days. Subjective:  Vision in right side slightly improved.  Assessment and Plan: Principal Problem:   Decreased vision of right eye -Continue 1 g Solu-Medrol daily x5 days per neurology -Start low dose NovoLog sliding scale in case glucose is high from Solu-Medrol - RPR is nonreactive, ESR is 24, HIV nonreactive, A1c 5.5 - ACE level, Lyme disease serology, fluorescent treponemal antibody, ANA with reflex, neuromyelitis optica autoantibody , antiphospholipid antibody pending - Neuro obtaining T spine MRI- patient asking for sedative prior to MRI - Neuro and opthal following patient  Active Problems:   Hidradenitis suppurativa Receives Humira once a week as outpatient  DVT prophylaxis:  enoxaparin (LOVENOX) injection 40 mg Start: 03/19/22 1000     Code Status: Full Code  Consultants: Ophthalmology, neurology Level of Care: Level of care: Med-Surg Disposition Plan:  Status is: Inpatient Remains inpatient appropriate because: Receiving IV Solu-Medrol-last doses 03/23/2022  Objective:   Vitals:   03/19/22 2006 03/20/22 0424 03/20/22 0909 03/20/22 1641  BP: (!) 141/91 128/85 137/87 (!) 134/94  Pulse: 98 86 98 88  Resp: '20 16 18 18  ' Temp: 99 F (37.2 C) 98.5 F (36.9 C) 98.6 F (37 C) 98.8 F (37.1 C)  TempSrc:  Oral Oral Oral  SpO2: 93% 99% 98% 98%  Weight:      Height:        Filed Weights   03/18/22 1152  Weight: 89.8 kg   Exam: General exam: Appears comfortable  HEENT: PERRLA, oral mucosa moist, no sclera icterus or thrush Respiratory system: Clear to auscultation. Respiratory effort normal. Cardiovascular system: S1 & S2 heard, regular rate and rhythm Gastrointestinal system: Abdomen soft, non-tender, nondistended. Normal bowel sounds   Central nervous system: Alert and oriented. No focal neurological deficits.- vision not checked Extremities: No cyanosis, clubbing or edema Skin: No rashes or ulcers Psychiatry:  Mood & affect appropriate.     Imaging and lab data was personally reviewed    CBC: Recent Labs  Lab 03/18/22 1842 03/19/22 0256  WBC 13.1* 10.9*  NEUTROABS 6.9  --   HGB 11.0* 11.4*  HCT 34.3* 33.9*  MCV 94.8 91.9  PLT 279 309    Basic Metabolic Panel: Recent Labs  Lab 03/18/22 1842 03/18/22 2200 03/19/22 0256  NA 138  --  139  K 5.5* 3.4* 4.1  CL 109  --  110  CO2 20*  --  18*  GLUCOSE 89  --  197*  BUN 10  --  12  CREATININE 0.66  --  0.84  CALCIUM 8.3*  --  9.1  MG  --   --  1.9    GFR: Estimated Creatinine Clearance: 105.5 mL/min (by C-G formula based on SCr of 0.84 mg/dL).  Scheduled Meds:  enoxaparin (LOVENOX) injection  40 mg Subcutaneous Q24H   insulin aspart  0-9 Units Subcutaneous TID WC  LORazepam  0.5 mg Intravenous Once   pantoprazole  40 mg Oral Daily   Continuous Infusions:  methylPREDNISolone (SOLU-MEDROL) injection 1,000 mg (03/20/22 1042)     LOS: 2 days   Author: Debbe Odea  03/20/2022 4:53 PM

## 2022-03-21 DIAGNOSIS — L732 Hidradenitis suppurativa: Secondary | ICD-10-CM | POA: Diagnosis not present

## 2022-03-21 LAB — ANA W/REFLEX IF POSITIVE: Anti Nuclear Antibody (ANA): NEGATIVE

## 2022-03-21 LAB — NEUROMYELITIS OPTICA AUTOAB, IGG: NMO-IgG: <1.5 U/mL (ref 0.0–3.0)

## 2022-03-21 LAB — FLUORESCENT TREPONEMAL AB(FTA)-IGG-BLD: Fluorescent Treponemal Ab, IgG: NONREACTIVE

## 2022-03-21 LAB — LYME DISEASE SEROLOGY W/REFLEX: Lyme Total Antibody EIA: NEGATIVE

## 2022-03-21 LAB — GLUCOSE, CAPILLARY
Glucose-Capillary: 126 mg/dL — ABNORMAL HIGH (ref 70–99)
Glucose-Capillary: 106 mg/dL — ABNORMAL HIGH (ref 70–99)
Glucose-Capillary: 168 mg/dL — ABNORMAL HIGH (ref 70–99)
Glucose-Capillary: 187 mg/dL — ABNORMAL HIGH (ref 70–99)

## 2022-03-21 MED ORDER — ZOLPIDEM TARTRATE 5 MG PO TABS: 5.0000 mg | ORAL_TABLET | Freq: Every evening | ORAL | Status: DC | PRN

## 2022-03-21 NOTE — Progress Notes (Signed)
Briefly seen. Reports improvement in vision. D4 IV steroids today. Will complete 5 days and refer to OP neurology.  -- Milon Dikes, MD Neurologist [facility] Pager: [phone]

## 2022-03-21 NOTE — Progress Notes (Signed)
[facility] Progress Note  Patient: Annette Benitez     BVQ:[id number]  DOA: 03/18/2022   PCP: Flossie Buffy, NP       Brief hospital course: This is a 36 year old female with a history of hidradenitis suppurativa who is on Humira and presented to the ED for blurred vision in the right eye which started last Friday and became increasingly severe. She was evaluated by neurology and ophthalmology the ED.  She was noted by ophthalmology to have an afferent pupillary defect and decreased vision in the right eye.  An MRI was negative.  Neurology placed her on IV Solu-Medrol 1 g daily for 5 days. Subjective:  She had just woken up when I examined her this morning and said the vision in her right eye was still poor but felt that it might improve the longer she stays awake.  She had no other complaints.  She reports that the Ativan that was given for the MRI yesterday helped her sleep and she thinks she might need something to help her sleep tonight.  Assessment and Plan: Principal Problem:   Decreased vision of right eye -Continue 1 g Solu-Medrol daily x5 days per neurology -Start low dose NovoLog sliding scale in case glucose is high from Solu-Medrol - RPR is nonreactive, ESR is 24, HIV nonreactive, A1c 5.5 - ACE level, Lyme disease serology, fluorescent treponemal antibody, ANA with reflex, neuromyelitis optica autoantibody , antiphospholipid antibody pending -7/9: Neuro obtained T spine MRI-this is reported to be negative - Neuro and opthal following patient  Active Problems:   Hidradenitis suppurativa Receives Humira once a week as outpatient  DVT prophylaxis:  enoxaparin (LOVENOX) injection 40 mg Start: 03/19/22 1000     Code Status: Full Code  Consultants: Ophthalmology, neurology Level of Care: Level of care: Med-Surg Disposition Plan:  Status is: Inpatient Remains inpatient appropriate because: Receiving IV Solu-Medrol- Objective:   Vitals:   03/20/22 1641  03/20/22 2057 03/21/22 0536 03/21/22 0939  BP: (!) 134/94 (!) 141/92 (!) 147/92 127/90  Pulse: 88 100 69 (!) 105  Resp: '18 18 18 19  ' Temp: 98.8 F (37.1 C) 98.7 F (37.1 C) 98.4 F (36.9 C) 98.9 F (37.2 C)  TempSrc: Oral Oral Oral   SpO2: 98% 96% 92% 95%  Weight:      Height:       Filed Weights   03/18/22 1152  Weight: 89.8 kg   Exam: General exam: Appears comfortable  HEENT: PERRLA, oral mucosa moist, no sclera icterus or thrush Respiratory system: Clear to auscultation. Respiratory effort normal. Cardiovascular system: S1 & S2 heard, regular rate and rhythm Gastrointestinal system: Abdomen soft, non-tender, nondistended. Normal bowel sounds   Central nervous system: Alert and oriented.  Poor visual acuity in right eye-unable to see shapes or colors-no other focal neurological deficits. Extremities: No cyanosis, clubbing or edema Skin: No rashes or ulcers Psychiatry:  Mood & affect appropriate.       Imaging and lab data was personally reviewed    CBC: Recent Labs  Lab 03/18/22 1842 03/19/22 0256  WBC 13.1* 10.9*  NEUTROABS 6.9  --   HGB 11.0* 11.4*  HCT 34.3* 33.9*  MCV 94.8 91.9  PLT 279 800    Basic Metabolic Panel: Recent Labs  Lab 03/18/22 1842 03/18/22 2200 03/19/22 0256  NA 138  --  139  K 5.5* 3.4* 4.1  CL 109  --  110  CO2 20*  --  18*  GLUCOSE 89  --  197*  BUN 10  --  12  CREATININE 0.66  --  0.84  CALCIUM 8.3*  --  9.1  MG  --   --  1.9    GFR: Estimated Creatinine Clearance: 105.5 mL/min (by C-G formula based on SCr of 0.84 mg/dL).  Scheduled Meds:  enoxaparin (LOVENOX) injection  40 mg Subcutaneous Q24H   insulin aspart  0-9 Units Subcutaneous TID WC   pantoprazole  40 mg Oral Daily   Continuous Infusions:  methylPREDNISolone (SOLU-MEDROL) injection 1,000 mg (03/21/22 1111)     LOS: 3 days   Author: Debbe Odea  03/21/2022 1:33 PM

## 2022-03-21 NOTE — TOC Initial Note (Signed)
Transition of Care [facility]) - Initial/Assessment Note    Patient Details  Name: Annette Benitez MRN: [id number] Date of Birth: March 10, 1986  Transition of [facility]) CM/SW Contact:    Tom-Johnson, Hershal Coria, RN Phone Number: 03/21/2022, 2:09 PM  Clinical Narrative:                  CM spoke with patient at bedside about needs for post hospital transition. Admitted for Decreased Rt Eye Vision. Currently on IV Solumedrol. From home with husband and three children. Both parents and a sister supportive. Independent with care and drive self prior to admission. Currently employed at [employer]. Does not have DME's at home.  PCP is  Nche, Bonna Gains, NP and uses [facility] on [address]  No PT/OT needs or recommendations noted at this time. CM will continue to follow with needs.   Expected Discharge Plan: Home/Self Care Barriers to Discharge: Continued Medical Work up   Patient Goals and CMS Choice Patient states their goals for this hospitalization and ongoing recovery are:: To return home CMS Medicare.gov Compare Post Acute Care list provided to:: Patient Choice offered to / list presented to : NA  Expected Discharge Plan and Services Expected Discharge Plan: Home/Self Care   Discharge Planning Services: CM Consult Post Acute Care Choice: NA Living arrangements for the past 2 months: Single Family Home                 DME Arranged: N/A DME Agency: NA       HH Arranged: NA HH Agency: NA        Prior Living Arrangements/Services Living arrangements for the past 2 months: Single Family Home Lives with:: Spouse, Minor Children Patient language and need for interpreter reviewed:: Yes Do you feel safe going back to the place where you live?: Yes      Need for Family Participation in Patient Care: Yes (Comment) Care giver support system in place?: Yes (comment)   Criminal Activity/Legal Involvement Pertinent to Current Situation/Hospitalization: No  - Comment as needed  Activities of Daily Living Home Assistive Devices/Equipment: None ADL Screening (condition at time of admission) Patient's cognitive ability adequate to safely complete daily activities?: Yes Is the patient deaf or have difficulty hearing?: No Does the patient have difficulty seeing, even when wearing glasses/contacts?: Yes Does the patient have difficulty concentrating, remembering, or making decisions?: No Patient able to express need for assistance with ADLs?: Yes Does the patient have difficulty dressing or bathing?: No Independently performs ADLs?: Yes (appropriate for developmental age) Does the patient have difficulty walking or climbing stairs?: No Weakness of Legs: None Weakness of Arms/Hands: None  Permission Sought/Granted Permission sought to share information with : Case Manager, Family Supports Permission granted to share information with : Yes, Verbal Permission Granted              Emotional Assessment Appearance:: Appears stated age Attitude/Demeanor/Rapport: Engaged, Gracious Affect (typically observed): Accepting, Appropriate, Calm, Hopeful Orientation: : Oriented to Self, Oriented to Place, Oriented to  Time, Oriented to Situation Alcohol / Substance Use: Not Applicable Psych Involvement: No (comment)  Admission diagnosis:  Optic neuritis, right [H46.9] Decreased vision of right eye [H54.61] Patient Active Problem List   Diagnosis Date Noted   Decreased vision of right eye 03/18/2022   Iron deficiency anemia due to chronic blood loss 01/05/2022   Vitamin D deficiency 01/05/2022   Immunocompromised state due to drug therapy (HCC) 11/27/2020   Encounter for initial prescription of  vaginal ring hormonal contraceptive 11/27/2020   Hx of herpes zoster 11/27/2020   Hidradenitis suppurativa 11/11/2016   PCP:  Anne Ng, NP Pharmacy:   [facility] [phone] Ginette Otto, [place] - [address] AT [facility] [address] [place] [address] Phone: [phone] Fax: [phone]     Social Determinants of Health (SDOH) Interventions    Readmission Risk Interventions     No data to display

## 2022-03-21 NOTE — Progress Notes (Signed)
Ophthalmology Progress Note  Subjective: Reports central scotoma smaller in size.  Objective: Vital signs in last 24 hours: Temp:  [98.4 F (36.9 C)-99.2 F (37.3 C)] 99.2 F (37.3 C) (07/10 1630) Pulse Rate:  [69-105] 82 (07/10 1630) Resp:  [18-19] 18 (07/10 1630) BP: (127-147)/(90-96) 142/96 (07/10 1630) SpO2:  [92 %-98 %] 98 % (07/10 1630) Weight change:  Last BM Date : 03/19/22  Intake/Output from previous day: 07/09 0701 - 07/10 0700 In: 1021 [P.O.:955; IV Piggyback:66] Out: 0  Intake/Output this shift: Total I/O In: 641 [P.O.:575; IV Piggyback:66] Out: -   Base Eye Exam          Visual Acuity (ETDRS)     Right Left  Dist [place] HM    Dist ph [place]        Tonometry (Tonopen)     Right Left  Pressure 20 19   Pupils     APD  Right [place] door APD  Left None    Visual Fields     Right Left      Full    Extraocular Movement     Right Left    Full Full    Neuro/Psych   Oriented x3: Yes  Mood/Affect: Normal    External Exam     Right Left  External Normal Normal     Slit Lamp Exam     Right Left  Lids/Lashes Normal Normal  Conjunctiva/Sclera White and quiet White and quiet  Cornea Clear Clear  Anterior Chamber Deep and quiet Deep and quiet  Iris Grossly normal Grossly normal  Lens  Clear  Clear           Fundus Exam     Right Left  Posterior Vitreous     Disc     C/D Ratio     Macula     Vessels     Periphery       Recent Labs    03/18/22 1842 03/18/22 2200 03/19/22 0256  WBC 13.1*  --  10.9*  HGB 11.0*  --  11.4*  HCT 34.3*  --  33.9*  NA 138  --  139  K 5.5* 3.4* 4.1  CL 109  --  110  CO2 20*  --  18*  BUN 10  --  12  CREATININE 0.66  --  0.84    Studies/Results: MR THORACIC SPINE W WO CONTRAST  Result Date: 03/20/2022 CLINICAL DATA:  demyelinating disease EXAM: MRI THORACIC WITHOUT AND WITH CONTRAST TECHNIQUE: Multiplanar and multiecho pulse sequences of the thoracic spine were obtained without and with intravenous  contrast. CONTRAST:  65m GADAVIST GADOBUTROL 1 MMOL/ML IV SOLN COMPARISON:  None Available. FINDINGS: Alignment:  Physiologic. Vertebrae: No fracture, evidence of discitis, or bone lesion. Cord:  Normal signal and morphology.  No abnormal enhancement. Paraspinal and other soft tissues: Negative. Disc levels: No spinal canal or neural foraminal stenosis. IMPRESSION: Normal MRI of the thoracic spine. No evidence of demyelinating disease. Electronically Signed   By: KUlyses JarredM.D.   On: 03/20/2022 20:41     Assessment/Plan:  Decreased vision, right eye -On exam patient has a significant APD and decreased vision. Reports pain with eye movements. Denies any autoimmune disease. Normal fundus exam. Suspect optic neuritis. MRI negative, MRI thoracic spine normal agree with neurology to continue IV steroids. OAlpinefor outpatient IV solumedrol from ophthalmology standpoint, defer to neurology and primary team to decide. Ophthalmology will sign off at this time and will follow patient in 2  weeks outpatient. Office will call patient.   CXR normal. RPR neg. Slight elevated ESR. Antiphospholipid, NMO, ANA, ACE, Lyme, FTA, pending.    LOS: 3 days   Annette Benitez T Annette Benitez 03/21/2022

## 2022-03-22 DIAGNOSIS — H5461 Unqualified visual loss, right eye, normal vision left eye: Secondary | ICD-10-CM | POA: Diagnosis not present

## 2022-03-22 LAB — ANTIPHOSPHOLIPID SYNDROME EVAL, BLD
Anticardiolipin IgA: <9 APL U/mL (ref 0–11)
Anticardiolipin IgG: <9 GPL U/mL (ref 0–14)
Anticardiolipin IgM: <9 MPL U/mL (ref 0–12)
DRVVT: 40.5 s (ref 0.0–47.0)
PTT Lupus Anticoagulant: 32.1 s (ref 0.0–43.5)
Phosphatydalserine, IgA: 1 APS Units (ref 0–19)
Phosphatydalserine, IgG: <9 Units (ref 0–30)
Phosphatydalserine, IgM: <10 Units (ref 0–30)

## 2022-03-22 LAB — MISC LABCORP TEST (SEND OUT): Labcorp test code: 505310

## 2022-03-22 LAB — GLUCOSE, CAPILLARY
Glucose-Capillary: 128 mg/dL — ABNORMAL HIGH (ref 70–99)
Glucose-Capillary: 121 mg/dL — ABNORMAL HIGH (ref 70–99)
Glucose-Capillary: 164 mg/dL — ABNORMAL HIGH (ref 70–99)
Glucose-Capillary: 157 mg/dL — ABNORMAL HIGH (ref 70–99)

## 2022-03-22 MED ORDER — METHYLPREDNISOLONE SODIUM SUCC 1000 MG IJ SOLR
1000.0000 mg | Freq: Every day | INTRAMUSCULAR | Status: AC
Start: 2022-03-23 — End: 2022-03-24
  Administered 2022-03-23 – 2022-03-24 (×2): 1000 mg via INTRAVENOUS
  Filled 2022-03-22 (×2): qty 16

## 2022-03-22 NOTE — Progress Notes (Signed)
[facility] Progress Note  Patient: Annette Benitez     ZLD:[id number]  DOA: 03/18/2022   PCP: Flossie Buffy, NP       Brief hospital course: This is a 36 year old female with a history of hidradenitis suppurativa who is on Humira and presented to the ED for blurred vision in the right eye which started last Friday and became increasingly severe. She was evaluated by neurology and ophthalmology the ED.  She was noted by ophthalmology to have an afferent pupillary defect and decreased vision in the right eye.   MRI brain,Orbits, C spine, T spine negative.  Neurology placed her on IV Solu-Medrol 1 g daily with a recommendation today to continue for 7 days.  Subjective:  She states the vision in her right eye has improved. She has no eye pain. No GI complaints.   Assessment and Plan: Principal Problem:   Decreased vision of right eye- ? Optic neuritis - RPR is nonreactive, ESR is 24, HIV nonreactive, A1c 5.5 - ACE level, Lyme disease serology, fluorescent treponemal antibody, ANA with reflex, neuromyelitis optica autoantibody , antiphospholipid antibody pending -7/9: Neuro obtained T spine MRI-this is reported to be negative - Neuro and opthal following patient- neuro recommends total 7 days Solumedrol- today is day 4  Active Problems:   Hidradenitis suppurativa Receives Humira once a week as outpatient  Mild hyperglycemia  - from Solumedrol - cont low dose novolog SSI  DVT prophylaxis:  enoxaparin (LOVENOX) injection 40 mg Start: 03/19/22 1000     Code Status: Full Code  Consultants: Ophthalmology, neurology Level of Care: Level of care: Med-Surg Disposition Plan:  Status is: Inpatient Remains inpatient appropriate because: Receiving IV Solu-Medrol- Objective:   Vitals:   03/21/22 1630 03/21/22 2127 03/22/22 0635 03/22/22 0957  BP: (!) 142/96 (!) 145/103 134/75 (!) 140/95  Pulse: 82 76 66 75  Resp: _0 Temp: 99.2 F (37.3 C) 97.7 F (36.5 C)  98.6  F (37 C)  TempSrc:  Oral    SpO2: 98% (!) 88% 92% 99%  Weight:      Height:       Filed Weights   03/18/22 1152  Weight: 89.8 kg   Exam: General exam: Appears comfortable  HEENT: PERRLA, oral mucosa moist, no sclera icterus or thrush Respiratory system: Clear to auscultation. Respiratory effort normal. Cardiovascular system: S1 & S2 heard, regular rate and rhythm Gastrointestinal system: Abdomen soft, non-tender, nondistended. Normal bowel sounds   Central nervous system: Alert and oriented. Moves all extermities Extremities: No cyanosis, clubbing or edema Skin: No rashes or ulcers Psychiatry:  Mood & affect appropriate.        Imaging and lab data was personally reviewed    CBC: Recent Labs  Lab 03/18/22 1842 03/19/22 0256  WBC 13.1* 10.9*  NEUTROABS 6.9  --   HGB 11.0* 11.4*  HCT 34.3* 33.9*  MCV 94.8 91.9  PLT 279 903    Basic Metabolic Panel: Recent Labs  Lab 03/18/22 1842 03/18/22 2200 03/19/22 0256  NA 138  --  139  K 5.5* 3.4* 4.1  CL 109  --  110  CO2 20*  --  18*  GLUCOSE 89  --  197*  BUN 10  --  12  CREATININE 0.66  --  0.84  CALCIUM 8.3*  --  9.1  MG  --   --  1.9    GFR: Estimated Creatinine Clearance: 105.5 mL/min (by C-G formula based on SCr of 0.84 mg/dL).  Scheduled Meds:  enoxaparin (LOVENOX) injection  40 mg Subcutaneous Q24H   insulin aspart  0-9 Units Subcutaneous TID WC   pantoprazole  40 mg Oral Daily   Continuous Infusions:     LOS: 4 days   Author: Debbe Odea  03/22/2022 2:57 PM

## 2022-03-22 NOTE — Progress Notes (Signed)
NEUROLOGY CONSULTATION PROGRESS NOTE   Date of service: March 22, 2022 Patient Name: Annette Benitez MRN:  [id number] DOB:  10-14-1985  Brief HPI  [name] is a 36 y.o. female with progressive blurring of vision and pain with eye movement of the right eye with APD. This constellation of symptoms is by far most consistent with optic neuritis and favor treating it as such.  MRI brain/orbits are negative but MRIs are 80 to 90% sensitive, but this still leaves 10 to 20% of patients with optic neuritis with negative MRI and therefore still favor IV Solu-Medrol given that there is objective evidence of some type of optic neuropathy (APD) as well as pain associated with it.   Interval Hx   Significantly improved pain in the right eye and much more clarity in vision although still cannot see normally through that eye but this is much better than few days ago.  Vitals   Vitals:   03/21/22 0939 03/21/22 1630 03/21/22 2127 03/22/22 0635  BP: 127/90 (!) 142/96 (!) 145/103 134/75  Pulse: (!) 105 82 76 66  Resp: 19 18 18 18   Temp: 98.9 F (37.2 C) 99.2 F (37.3 C) 97.7 F (36.5 C)   TempSrc:   Oral   SpO2: 95% 98% (!) 88% 92%  Weight:      Height:         Body mass index is 31.01 kg/m.  Physical Exam   General: Laying comfortably in bed; in no acute distress.  HENT: Normal oropharynx and mucosa. Normal external appearance of ears and nose.  Neck: Supple, no pain or tenderness  CV: No JVD. No peripheral edema.  Pulmonary: Symmetric Chest rise. Normal respiratory effort.  Abdomen: Soft to touch, non-tender.  Ext: No cyanosis, edema, or deformity  Skin: No rash. Normal palpation of skin.   Musculoskeletal: Normal digits and nails by inspection. No clubbing.  Neurologic Examination  General: Awake alert in no distress HEENT: Normocephalic atraumatic CVs: Regular rhythm Respiratory: Breathing well saturating normally on room air Abdomen nondistended nontender Extremities warm well  perfused Neurological exam Awake alert oriented x3 No dysarthria No aphasia Cranial nerves: Pupils equal round reactive to light, I could not elicit a significant RAPD.  Visual fields full, visual acuity diminished in the right eye.  Discs appear normal.  Facial sensation intact, face symmetric, tongue and palate mid line Motor exam 5/5 strength all 4 extremities without drift Sensation intact to light touch Coordination with no dysmetria  Labs   Basic Metabolic Panel:  Lab Results  Component Value Date   NA 139 03/19/2022   K 4.1 03/19/2022   CO2 18 (L) 03/19/2022   GLUCOSE 197 (H) 03/19/2022   BUN 12 03/19/2022   CREATININE 0.84 03/19/2022   CALCIUM 9.1 03/19/2022   GFRNONAA >60 03/19/2022   HbA1c:  Lab Results  Component Value Date   HGBA1C 5.6 03/19/2022   LDL:  Lab Results  Component Value Date   LDLCALC 141 (H) 01/03/2022   ANA and double-stranded DNA positive.  Imaging and Diagnostic studies   MRI brain with and without contrast: No acute intracranial abnormalities.  No demyelinating enhancing lesions.  MRI orbits with and without contrast: Normal.  No acute abnormalities.  MRI cervical spine with and without contrast: No acute abnormality.  No evidence of demyelinating disease.  MR thoracic spine with and without contrast:  No acute abnormality.  No evidence of demyelination.   Impression   Annette Benitez is a 36 y.o. female with progressive  blurring of vision and pain with eye movement of the right eye with APD. Imaging in negative. This constellation of symptoms is by far most consistent with optic neuritis and favor treating it as such. MRIs 80 to 90% sensitive, but this still leaves 10 to 20% of patients with optic neuritis with negative MRI and therefore would still favor IV Solu-Medrol given that there is objective evidence of some type of optic neuropathy (APD) as well as pain associated with it.  She has no other demyelinating lesions on imaging  and no prior episodes of focal neuro deficit that she can recall. At this time, she does not fit revised Mcdonald criteria for MS and thus does not have a diagnosis of Multiple sclerosi--likely a clinically isolated syndrome of demyelination possibly related to Humira as there have been multiple case reports of such.  Her neurologic examination is notable for decreased visual acuity in R eye. No other deficit at this time-I was unable to elicit RAPD.  Recommendations  -Given improvement with steroids over the past 4 days, will do a total of 7 days of IV Solu-Medrol.  Appreciate internal medicine managing her sugars. -NMO panel, ABLA, ACE, Lyme pending.  Mogg antibody pending. -Agree with prior recommendations for discontinuing Humira given association with optic neuritis. -Appreciate ophthalmology consultation. -Outpatient follow-up with Dr. Epimenio Foot of [facility] neurology in 2 to 4 weeks -Outpatient ophthalmology follow-up with Dr. Jaynie Collins weeks after discharge.  I will be available as needed.  Please feel free to call with questions.  Plan discussed with Dr. Butler Denmark  -- Milon Dikes, MD Neurologist [facility] Pager: [phone]

## 2022-03-23 DIAGNOSIS — H469 Unspecified optic neuritis: Secondary | ICD-10-CM

## 2022-03-23 LAB — GLUCOSE, CAPILLARY
Glucose-Capillary: 137 mg/dL — ABNORMAL HIGH (ref 70–99)
Glucose-Capillary: 129 mg/dL — ABNORMAL HIGH (ref 70–99)
Glucose-Capillary: 154 mg/dL — ABNORMAL HIGH (ref 70–99)
Glucose-Capillary: 164 mg/dL — ABNORMAL HIGH (ref 70–99)

## 2022-03-23 MED ORDER — HYDRALAZINE HCL 20 MG/ML IJ SOLN: 5.0000 mg | INTRAMUSCULAR | Status: DC | PRN

## 2022-03-23 MED ORDER — ORAL CARE MOUTH RINSE
15.0000 mL | OROMUCOSAL | Status: DC | PRN
Start: 2022-03-23 — End: 2022-03-24

## 2022-03-23 NOTE — Progress Notes (Addendum)
PROGRESS NOTE    Zayana Salvador  NWG:[id number] DOB: May 20, 1986 DOA: 03/18/2022 PCP: Anne Ng, NP     Brief Narrative:  Annette Benitez is a 36 year old female with a history of hidradenitis suppurativa who is on Humira and presented to the ED for blurred vision in the right eye which started last Friday and became increasingly severe. She was evaluated by neurology and ophthalmology the ED.  She was noted by ophthalmology to have an afferent pupillary defect and decreased vision in the right eye.  MRI brain,Orbits, C spine, T spine negative. Neurology placed her on IV Solu-Medrol 1 g daily with a recommendation today to continue for 7 days.  New events last 24 hours / Subjective: Continues to have some blurriness in her right eye but improving.  Assessment & Plan:   Principal Problem:   Optic neuritis Active Problems:   Decreased vision of right eye   Hidradenitis suppurativa  Optic neuritis -Appreciate neurology -Complete 7 days of IV Solu-Medrol, today is day #6 (7/7-7/13)  -Follow-up with ophthalmology Dr. Zenaida Niece in 2 weeks, office will call the patient -Follow-up with Dr. Epimenio Foot of [facility] neurology in 2 to 4 weeks  Hidradenitis suppurativa -Discontinue Humira given association with optic neuritis  Obesity -Estimated body mass index is 31.01 kg/m as calculated from the following:   Height as of this encounter: 5\' 7"  (1.702 m).   Weight as of this encounter: 89.8 kg.  Hypokalemia -Resolved   DVT prophylaxis:  enoxaparin (LOVENOX) injection 40 mg Start: 03/19/22 1000  Code Status: Full code Family Communication: No family at bedside Disposition Plan:  Status is: Inpatient Remains inpatient appropriate because: IV Solu-Medrol   Antimicrobials:  Anti-infectives (From admission, onward)    None        Objective: Vitals:   03/22/22 1634 03/22/22 2108 03/23/22 0619 03/23/22 0938  BP: (!) 140/99 (!) 161/92 (!) 150/93 131/87  Pulse: 66 61 63 80  Resp:  19 18 20 17   Temp: 98.5 F (36.9 C) 97.9 F (36.6 C) 98.6 F (37 C) 98.3 F (36.8 C)  TempSrc:      SpO2: 97% 98% 96% 93%  Weight:      Height:        Intake/Output Summary (Last 24 hours) at 03/23/2022 1342 Last data filed at 03/23/2022 0900 Gross per 24 hour  Intake 631.84 ml  Output 0 ml  Net 631.84 ml   Filed Weights   03/18/22 1152  Weight: 89.8 kg    Examination:  General exam: Appears calm and comfortable  Respiratory system: Clear to auscultation. Respiratory effort normal. No respiratory distress. No conversational dyspnea.  Cardiovascular system: S1 & S2 heard, RRR. No murmurs. No pedal edema. Gastrointestinal system: Abdomen is nondistended, soft and nontender. Normal bowel sounds heard. Central nervous system: Alert and oriented. Extremities: Symmetric in appearance  Skin: No rashes, lesions or ulcers on exposed skin  Psychiatry: Judgement and insight appear normal. Mood & affect appropriate.   Data Reviewed: I have personally reviewed following labs and imaging studies  CBC: Recent Labs  Lab 03/18/22 1842 03/19/22 0256  WBC 13.1* 10.9*  NEUTROABS 6.9  --   HGB 11.0* 11.4*  HCT 34.3* 33.9*  MCV 94.8 91.9  PLT 279 281   Basic Metabolic Panel: Recent Labs  Lab 03/18/22 1842 03/18/22 2200 03/19/22 0256  NA 138  --  139  K 5.5* 3.4* 4.1  CL 109  --  110  CO2 20*  --  18*  GLUCOSE 89  --  197*  BUN 10  --  12  CREATININE 0.66  --  0.84  CALCIUM 8.3*  --  9.1  MG  --   --  1.9   GFR: Estimated Creatinine Clearance: 105.5 mL/min (by C-G formula based on SCr of 0.84 mg/dL). Liver Function Tests: No results for input(s): "AST", "ALT", "ALKPHOS", "BILITOT", "PROT", "ALBUMIN" in the last 168 hours. No results for input(s): "LIPASE", "AMYLASE" in the last 168 hours. No results for input(s): "AMMONIA" in the last 168 hours. Coagulation Profile: No results for input(s): "INR", "PROTIME" in the last 168 hours. Cardiac Enzymes: No results for  input(s): "CKTOTAL", "CKMB", "CKMBINDEX", "TROPONINI" in the last 168 hours. BNP (last 3 results) No results for input(s): "PROBNP" in the last 8760 hours. HbA1C: No results for input(s): "HGBA1C" in the last 72 hours. CBG: Recent Labs  Lab 03/22/22 1133 03/22/22 1635 03/22/22 2110 03/23/22 0736 03/23/22 1140  GLUCAP 121* 164* 157* 137* 129*   Lipid Profile: No results for input(s): "CHOL", "HDL", "LDLCALC", "TRIG", "CHOLHDL", "LDLDIRECT" in the last 72 hours. Thyroid Function Tests: No results for input(s): "TSH", "T4TOTAL", "FREET4", "T3FREE", "THYROIDAB" in the last 72 hours. Anemia Panel: No results for input(s): "VITAMINB12", "FOLATE", "FERRITIN", "TIBC", "IRON", "RETICCTPCT" in the last 72 hours. Sepsis Labs: No results for input(s): "PROCALCITON", "LATICACIDVEN" in the last 168 hours.  No results found for this or any previous visit (from the past 240 hour(s)).    Radiology Studies: No results found.    Scheduled Meds:  enoxaparin (LOVENOX) injection  40 mg Subcutaneous Q24H   insulin aspart  0-9 Units Subcutaneous TID WC   pantoprazole  40 mg Oral Daily   Continuous Infusions:  methylPREDNISolone (SOLU-MEDROL) injection 1,000 mg (03/23/22 0531)     LOS: 5 days     Noralee Stain, DO [facility] 03/23/2022, 1:42 PM   Available via Epic secure chat 7am-7pm After these hours, please refer to coverage provider listed on [email or web address]

## 2022-03-23 NOTE — Plan of Care (Signed)
  Problem: Clinical Measurements: Goal: Ability to maintain clinical measurements within normal limits will improve Outcome: Progressing   

## 2022-03-24 DIAGNOSIS — H469 Unspecified optic neuritis: Secondary | ICD-10-CM | POA: Diagnosis not present

## 2022-03-24 LAB — GLUCOSE, CAPILLARY
Glucose-Capillary: 119 mg/dL — ABNORMAL HIGH (ref 70–99)
Glucose-Capillary: 145 mg/dL — ABNORMAL HIGH (ref 70–99)

## 2022-03-24 NOTE — TOC Transition Note (Signed)
Transition of Care [facility]) - CM/SW Discharge Note   Patient Details  Name: Stefanie Hodgens MRN: [id number] Date of Birth: 1985-11-07  [facility] CM/SW Contact:  Tom-Johnson, Hershal Coria, RN Phone Number: 03/24/2022, 12:46 PM   Clinical Narrative:     Patient is scheduled for discharge today. No TOC needs or recommendations noted. Denies any needs. Husband to transport at discharge. No further TOC needs noted.    Final next level of care: Home/Self Care Barriers to Discharge: Barriers Resolved   Patient Goals and CMS Choice Patient states their goals for this hospitalization and ongoing recovery are:: To return home CMS Medicare.gov Compare Post Acute Care list provided to:: Patient Choice offered to / list presented to : NA  Discharge Placement                Patient to be transferred to facility by: Husband      Discharge Plan and Services   Discharge Planning Services: CM Consult Post Acute Care Choice: NA          DME Arranged: N/A DME Agency: NA       HH Arranged: NA HH Agency: NA        Social Determinants of Health (SDOH) Interventions     Readmission Risk Interventions     No data to display

## 2022-03-24 NOTE — Discharge Summary (Signed)
Physician Discharge Summary  Annette Benitez UYQ:[id number] DOB: 07-Nov-1985 DOA: 03/18/2022  PCP: Anne Ng, NP  Admit date: 03/18/2022 Discharge date: 03/24/2022  Admitted From: Home Disposition:  Home  Recommendations for Outpatient Follow-up:  Follow up with PCP in 1 week Follow up with ophthalmology, Dr. Zenaida Niece in 2 weeks Follow-up with neurology, Dr. Epimenio Foot in 2 to 4 weeks  Discharge Condition: Stable CODE STATUS: Full code Diet recommendation: Regular diet  Brief/Interim Summary: Annette Benitez is a 36 year old female with a history of hidradenitis suppurativa who is on Humira and presented to the ED for blurred vision in the right eye which started last Friday and became increasingly severe. She was evaluated by neurology and ophthalmology the ED.  She was noted by ophthalmology to have an afferent pupillary defect and decreased vision in the right eye.  MRI brain,Orbits, C spine, T spine negative. Neurology placed her on IV Solu-Medrol 1 g daily with a recommendation today to continue for 7 days.  Her vision continued to improve and she was discharged home in stable condition.  Discharge Diagnoses:   Principal Problem:   Optic neuritis Active Problems:   Decreased vision of right eye   Hidradenitis suppurativa   Optic neuritis -Appreciate neurology -Completed 7 days of IV Solu-Medrol (7/7-7/13)  -Follow-up with ophthalmology Dr. Zenaida Niece in 2 weeks, office will call the patient -Follow-up with Dr. Epimenio Foot of [facility] neurology in 2 to 4 weeks   Hidradenitis suppurativa -Discontinue Humira given association with optic neuritis -Follow-up with outpatient dermatology for alternatives   Obesity -Estimated body mass index is 31.01 kg/m as calculated from the following:   Height as of this encounter: 5\' 7"  (1.702 m).   Weight as of this encounter: 89.8 kg.   Hypokalemia -Resolved  Discharge Instructions  Discharge Instructions     Ambulatory referral to Neurology    Complete by: As directed    An appointment is requested in approximately: 2 weeks   Call MD for:  difficulty breathing, headache or visual disturbances   Complete by: As directed    Call MD for:  extreme fatigue   Complete by: As directed    Call MD for:  hives   Complete by: As directed    Call MD for:  persistant dizziness or light-headedness   Complete by: As directed    Call MD for:  persistant nausea and vomiting   Complete by: As directed    Call MD for:  severe uncontrolled pain   Complete by: As directed    Call MD for:  temperature >100.4   Complete by: As directed    Discharge instructions   Complete by: As directed    You were cared for by a hospitalist during your hospital stay. If you have any questions about your discharge medications or the care you received while you were in the hospital after you are discharged, you can call the unit and ask to speak with the hospitalist on call if the hospitalist that took care of you is not available. Once you are discharged, your primary care physician will handle any further medical issues. Please note that NO REFILLS for any discharge medications will be authorized once you are discharged, as it is imperative that you return to your primary care physician (or establish a relationship with a primary care physician if you do not have one) for your aftercare needs so that they can reassess your need for medications and monitor your lab values.   Increase activity slowly  Complete by: As directed       Allergies as of 03/24/2022   No Known Allergies      Medication List     STOP taking these medications    etonogestrel-ethinyl estradiol 0.12-0.015 MG/24HR vaginal ring Commonly known as: NUVARING   Humira Pen 40 MG/0.8ML Pnkt Generic drug: Adalimumab       TAKE these medications    Vitamin D (Ergocalciferol) 1.25 MG (50000 UNIT) Caps capsule Commonly known as: DRISDOL Take 1 capsule (50,000 Units total) by mouth every  7 (seven) days.        Follow-up Information     Diona Foley, MD Follow up.   Specialty: Ophthalmology Why: Her office will call you to set up appointment. Please follow up with their office if you do not hear from them. Contact information: [address] [place] [address] [phone]         Asa Lente, MD Follow up.   Specialty: Neurology Why: Referral was placed at time of discharge. Please call his office to follow up if you do not hear from them regarding appointment. Contact information: [address] [place] [address] [phone]         Nche, Bonna Gains, NP Follow up.   Specialty: Internal Medicine Contact information: [address] [place] [address] [phone]                No Known Allergies    Procedures/Studies: MR THORACIC SPINE W WO CONTRAST  Result Date: 03/20/2022 CLINICAL DATA:  demyelinating disease EXAM: MRI THORACIC WITHOUT AND WITH CONTRAST TECHNIQUE: Multiplanar and multiecho pulse sequences of the thoracic spine were obtained without and with intravenous contrast. CONTRAST:  43mL GADAVIST GADOBUTROL 1 MMOL/ML IV SOLN COMPARISON:  None Available. FINDINGS: Alignment:  Physiologic. Vertebrae: No fracture, evidence of discitis, or bone lesion. Cord:  Normal signal and morphology.  No abnormal enhancement. Paraspinal and other soft tissues: Negative. Disc levels: No spinal canal or neural foraminal stenosis. IMPRESSION: Normal MRI of the thoracic spine. No evidence of demyelinating disease. Electronically Signed   By: Deatra Robinson M.D.   On: 03/20/2022 20:41   DG Chest Portable 1 View  Result Date: 03/18/2022 CLINICAL DATA:  Altered mental status. EXAM: PORTABLE CHEST 1 VIEW COMPARISON:  Chest x-ray dated October 13, 2020. FINDINGS: The heart size and mediastinal contours are within normal limits. Both lungs are clear. The visualized skeletal structures are unremarkable. IMPRESSION: No active  disease. Electronically Signed   By: Obie Dredge M.D.   On: 03/18/2022 21:32   MR BRAIN W WO CONTRAST  Result Date: 03/18/2022 CLINICAL DATA:  Optic neuritis suspected EXAM: MRI HEAD AND ORBITS WITHOUT AND WITH CONTRAST TECHNIQUE: Multiplanar, multiecho pulse sequences of the brain and surrounding structures were obtained without and with intravenous contrast. Multiplanar, multiecho pulse sequences of the orbits and surrounding structures were obtained including fat saturation techniques, before and after intravenous contrast administration. CONTRAST:  8.19mL GADAVIST GADOBUTROL 1 MMOL/ML IV SOLN COMPARISON:  None Available. FINDINGS: MRI HEAD FINDINGS Brain: No acute infarct, mass effect or extra-axial collection. No acute or chronic hemorrhage. Normal white matter signal, parenchymal volume and CSF spaces. The midline structures are normal. Vascular: Major flow voids are preserved. Skull and upper cervical spine: Normal calvarium and skull base. Visualized upper cervical spine and soft tissues are normal. Sinuses/Orbits:No paranasal sinus fluid levels or advanced mucosal thickening. No mastoid or middle ear effusion. Normal orbits. MRI ORBITS FINDINGS Orbits: No traumatic or inflammatory finding.  Globes, optic nerves, orbital fat, extraocular muscles, vascular structures, and lacrimal glands are normal. Visualized sinuses: Clear. Soft tissues: Negative. IMPRESSION: Normal MRI of the brain and orbits. Electronically Signed   By: Deatra Robinson M.D.   On: 03/18/2022 20:34   MR ORBITS W WO CONTRAST  Result Date: 03/18/2022 CLINICAL DATA:  Optic neuritis suspected EXAM: MRI HEAD AND ORBITS WITHOUT AND WITH CONTRAST TECHNIQUE: Multiplanar, multiecho pulse sequences of the brain and surrounding structures were obtained without and with intravenous contrast. Multiplanar, multiecho pulse sequences of the orbits and surrounding structures were obtained including fat saturation techniques, before and after  intravenous contrast administration. CONTRAST:  8.26mL GADAVIST GADOBUTROL 1 MMOL/ML IV SOLN COMPARISON:  None Available. FINDINGS: MRI HEAD FINDINGS Brain: No acute infarct, mass effect or extra-axial collection. No acute or chronic hemorrhage. Normal white matter signal, parenchymal volume and CSF spaces. The midline structures are normal. Vascular: Major flow voids are preserved. Skull and upper cervical spine: Normal calvarium and skull base. Visualized upper cervical spine and soft tissues are normal. Sinuses/Orbits:No paranasal sinus fluid levels or advanced mucosal thickening. No mastoid or middle ear effusion. Normal orbits. MRI ORBITS FINDINGS Orbits: No traumatic or inflammatory finding. Globes, optic nerves, orbital fat, extraocular muscles, vascular structures, and lacrimal glands are normal. Visualized sinuses: Clear. Soft tissues: Negative. IMPRESSION: Normal MRI of the brain and orbits. Electronically Signed   By: Deatra Robinson M.D.   On: 03/18/2022 20:34   MR CERVICAL SPINE W WO CONTRAST  Result Date: 03/18/2022 CLINICAL DATA:  Right eye vision changes for the past week. EXAM: MRI CERVICAL SPINE WITHOUT AND WITH CONTRAST TECHNIQUE: Multiplanar and multiecho pulse sequences of the cervical spine, to include the craniocervical junction and cervicothoracic junction, were obtained without and with intravenous contrast. CONTRAST:  8.17mL GADAVIST GADOBUTROL 1 MMOL/ML IV SOLN COMPARISON:  None Available. FINDINGS: Alignment: Straightening of the normal cervical lordosis. No listhesis. Vertebrae: No fracture, evidence of discitis, or bone lesion. Cord: Normal signal and morphology. No abnormal intrathecal enhancement. Posterior Fossa, vertebral arteries, paraspinal tissues: Negative. Disc levels: No significant disc bulge or herniation.  No stenosis. IMPRESSION: 1. No acute abnormality.  No evidence of demyelinating disease. Electronically Signed   By: Obie Dredge M.D.   On: 03/18/2022 20:31        Discharge Exam: Vitals:   03/24/22 0504 03/24/22 0834  BP: (!) 128/93 136/81  Pulse: 79 98  Resp: 17 16  Temp: 99.2 F (37.3 C) 98.6 F (37 C)  SpO2: 97% 99%    General: Pt is alert, awake, not in acute distress Cardiovascular: RRR, S1/S2 +, no edema Respiratory: CTA bilaterally, no wheezing, no rhonchi, no respiratory distress, no conversational dyspnea  Abdominal: Soft, NT, ND, bowel sounds + Extremities: no edema, no cyanosis Psych: Normal mood and affect, stable judgement and insight     The results of significant diagnostics from this hospitalization (including imaging, microbiology, ancillary and laboratory) are listed below for reference.     Microbiology: No results found for this or any previous visit (from the past 240 hour(s)).   Labs: BNP (last 3 results) No results for input(s): "BNP" in the last 8760 hours. Basic Metabolic Panel: Recent Labs  Lab 03/18/22 1842 03/18/22 2200 03/19/22 0256  NA 138  --  139  K 5.5* 3.4* 4.1  CL 109  --  110  CO2 20*  --  18*  GLUCOSE 89  --  197*  BUN 10  --  12  CREATININE 0.66  --  0.84  CALCIUM 8.3*  --  9.1  MG  --   --  1.9   Liver Function Tests: No results for input(s): "AST", "ALT", "ALKPHOS", "BILITOT", "PROT", "ALBUMIN" in the last 168 hours. No results for input(s): "LIPASE", "AMYLASE" in the last 168 hours. No results for input(s): "AMMONIA" in the last 168 hours. CBC: Recent Labs  Lab 03/18/22 1842 03/19/22 0256  WBC 13.1* 10.9*  NEUTROABS 6.9  --   HGB 11.0* 11.4*  HCT 34.3* 33.9*  MCV 94.8 91.9  PLT 279 281   Cardiac Enzymes: No results for input(s): "CKTOTAL", "CKMB", "CKMBINDEX", "TROPONINI" in the last 168 hours. BNP: Invalid input(s): "POCBNP" CBG: Recent Labs  Lab 03/23/22 1140 03/23/22 1629 03/23/22 2046 03/24/22 0728 03/24/22 1129  GLUCAP 129* 154* 164* 119* 145*   D-Dimer No results for input(s): "DDIMER" in the last 72 hours. Hgb A1c No results for input(s):  "HGBA1C" in the last 72 hours. Lipid Profile No results for input(s): "CHOL", "HDL", "LDLCALC", "TRIG", "CHOLHDL", "LDLDIRECT" in the last 72 hours. Thyroid function studies No results for input(s): "TSH", "T4TOTAL", "T3FREE", "THYROIDAB" in the last 72 hours.  Invalid input(s): "FREET3" Anemia work up No results for input(s): "VITAMINB12", "FOLATE", "FERRITIN", "TIBC", "IRON", "RETICCTPCT" in the last 72 hours. Urinalysis    Component Value Date/Time   COLORURINE YELLOW 08/17/2014 1218   APPEARANCEUR CLOUDY (A) 08/17/2014 1218   LABSPEC 1.027 08/17/2014 1218   PHURINE 5.5 08/17/2014 1218   GLUCOSEU NEGATIVE 08/17/2014 1218   HGBUR NEGATIVE 08/17/2014 1218   BILIRUBINUR NEGATIVE 08/17/2014 1218   KETONESUR 15 (A) 08/17/2014 1218   PROTEINUR NEGATIVE 08/17/2014 1218   UROBILINOGEN 0.2 08/17/2014 1218   NITRITE NEGATIVE 08/17/2014 1218   LEUKOCYTESUR MODERATE (A) 08/17/2014 1218   Sepsis Labs Recent Labs  Lab 03/18/22 1842 03/19/22 0256  WBC 13.1* 10.9*   Microbiology No results found for this or any previous visit (from the past 240 hour(s)).   Patient was seen and examined on the day of discharge and was found to be in stable condition. Time coordinating discharge: 25 minutes including assessment and coordination of care, as well as examination of the patient.   SIGNED:  Noralee Stain, DO [facility] 03/24/2022, 12:06 PM

## 2022-04-02 ENCOUNTER — Other Ambulatory Visit: Payer: Self-pay | Admitting: Nurse Practitioner

## 2022-04-02 DIAGNOSIS — E559 Vitamin D deficiency, unspecified: Secondary | ICD-10-CM

## 2022-04-06 ENCOUNTER — Encounter: Payer: Self-pay | Admitting: Nurse Practitioner

## 2022-04-06 ENCOUNTER — Ambulatory Visit: Payer: Federal, State, Local not specified - PPO | Admitting: Nurse Practitioner

## 2022-04-06 VITALS — BP 116/80 | HR 88 | Temp 97.0°F | Ht 67.0 in | Wt 197.6 lb

## 2022-04-06 DIAGNOSIS — H469 Unspecified optic neuritis: Secondary | ICD-10-CM | POA: Diagnosis not present

## 2022-04-06 DIAGNOSIS — R739 Hyperglycemia, unspecified: Secondary | ICD-10-CM

## 2022-04-06 LAB — GLUCOSE, POCT (MANUAL RESULT ENTRY): POC Glucose: 71 mg/dl (ref 70–99)

## 2022-04-06 NOTE — Progress Notes (Signed)
Established Patient Visit  Patient: Annette Benitez   DOB: August 29, 1986   35 y.o. Female  MRN: [id number] Visit Date: 04/11/2022  Subjective:    Chief Complaint  Patient presents with   Office Visit    Hospital F/u 03/18/22; BP & Blood sugar  No concerns today    HPI Optic neuritis Possibly related to humira? Last Humira dose 03/03/2022, onset of symptoms 6/31/2023 associated with generalized fatigue. Hospitalized 07/7 to 7/13 due to sudden vision loss in [place]. She  Was treated with systemic corticosteroid (IV and oral) She reports improved vision but still not able to drive. F/up appt with appt with neurology 04/07/22 and with ophthalmology 04/08/22. She is waiting for appt with dermatology. MRI and CT were negative. Labs were unremarkable.  Today she is concerned about persistent hyperglycemia after completion of oral prednisone. CBG random: 71 today  Reviewed medical, surgical, and social history today  Medications: Outpatient Medications Prior to Visit  Medication Sig   ELURYNG 0.12-0.015 MG/24HR vaginal ring Place vaginally.   [DISCONTINUED] Vitamin D, Ergocalciferol, (DRISDOL) 1.25 MG (50000 UNIT) CAPS capsule Take 1 capsule (50,000 Units total) by mouth every 7 (seven) days.   No facility-administered medications prior to visit.   Reviewed past medical and social history.   ROS per HPI above      Objective:  BP 116/80 (BP Location: Right Arm, Patient Position: Sitting, Cuff Size: Normal)   Pulse 88   Temp (!) 97 F (36.1 C) (Temporal)   Ht 5\' 7"  (1.702 m)   Wt 197 lb 9.6 oz (89.6 kg)   SpO2 99%   BMI 30.95 kg/m      Physical Exam Eyes:     Extraocular Movements: Extraocular movements intact.     Conjunctiva/sclera: Conjunctivae normal.  Cardiovascular:     Rate and Rhythm: Normal rate.     Pulses: Normal pulses.  Pulmonary:     Effort: Pulmonary effort is normal.  Neurological:     Mental Status: She is alert and oriented to person,  place, and time.  Psychiatric:        Mood and Affect: Mood normal.        Behavior: Behavior normal.        Thought Content: Thought content normal.     Results for orders placed or performed in visit on 04/06/22  POCT Glucose (CBG)  Result Value Ref Range   POC Glucose 71 70 - 99 mg/dl      Assessment & Plan:    Problem List Items Addressed This Visit       Nervous and Auditory   Optic neuritis    Possibly related to humira? Last Humira dose 03/03/2022, onset of symptoms 6/31/2023 associated with generalized fatigue. Hospitalized 07/7 to 7/13 due to sudden vision loss in [place]. She  Was treated with systemic corticosteroid (IV and oral) She reports improved vision but still not able to drive. F/up appt with appt with neurology 04/07/22 and with ophthalmology 04/08/22. She is waiting for appt with dermatology. MRI and CT were negative. Labs were unremarkable.  Today she is concerned about persistent hyperglycemia after completion of oral prednisone. CBG random: 71 today      Other Visit Diagnoses     Hyperglycemia    -  Primary   Relevant Orders   POCT Glucose (CBG) (Completed)      Return in about 39 weeks (around 01/04/2023) for CPE (fasting).  Wilfred Lacy, NP

## 2022-04-06 NOTE — Patient Instructions (Signed)
Maintain appt with neurology and ophthalmology

## 2022-04-07 ENCOUNTER — Ambulatory Visit: Payer: Federal, State, Local not specified - PPO | Admitting: Neurology

## 2022-04-07 ENCOUNTER — Encounter: Payer: Self-pay | Admitting: Nurse Practitioner

## 2022-04-07 ENCOUNTER — Encounter: Payer: Self-pay | Admitting: Neurology

## 2022-04-07 VITALS — BP 120/74 | HR 93 | Ht 67.0 in | Wt 196.8 lb

## 2022-04-07 DIAGNOSIS — H469 Unspecified optic neuritis: Secondary | ICD-10-CM

## 2022-04-07 DIAGNOSIS — E559 Vitamin D deficiency, unspecified: Secondary | ICD-10-CM

## 2022-04-07 DIAGNOSIS — H5461 Unqualified visual loss, right eye, normal vision left eye: Secondary | ICD-10-CM | POA: Diagnosis not present

## 2022-04-07 DIAGNOSIS — L732 Hidradenitis suppurativa: Secondary | ICD-10-CM

## 2022-04-07 LAB — HM DIABETES EYE EXAM

## 2022-04-07 NOTE — Progress Notes (Signed)
[facility]  PATIENT: Annette Benitez DOB: 19-Apr-1986  REFERRING DOCTOR OR PCP: Wilfred Lacy NP; Dessa Phi, DO SOURCE: Patient, notes from hospital admission including neurology and ophthalmology consults, laboratory and imaging reports, multiple MRI images personally reviewed.  _________________________________   HISTORICAL  CHIEF COMPLAINT:  Chief Complaint  Patient presents with   New Patient (Initial Visit)    Rm 16, w husband. Hospital referral for optic neuritis. Pt still unable to see clearly and difficulty reading. Vision is not sharp.     HISTORY OF PRESENT ILLNESS:  I had the pleasure seeing your patient, Annette Benitez, at [facility] for neurologic consultation regarding her recent optic neuritis.  She is a 36 year old woman who had the onset of right visual loss combined with right orbital pain upon eye movements.   She felt especially tired at the onset.    Symptom increased over 4 days.   Vision seemed shaded initially and then completely dark.   As it worsened, she was unable to see hand waving and even light.    She presented to the [facility] emergency room 03/18/2022 (7 days after symptoms started).   She was seen by Dr. Lucianne Lei (ophthalmology).  Vision was counting fingers OD and 20/20 OS.   She had an APD.    Fundoscopic exam was normal.     She has hydradenitis suppurativa diagnosed at age 57.   She was on Humira since 2018/2019 for with benefit .   It worked better than topical treatment and antibiotics.  She has it on hold since the optic neuritis.     Besides the visual problems she has no other neurologic issue at this time.  Specifically, gait, balance, strength, sensation, coordination and bladder function are all normal/baseline.  No FH of MS.   Her mom and aunt may also have mild HS and never needed a biologic treatment.     We spent much of the visit going over prognosis of optic neuritis with her MRI and laboratory  presentation as well as discussing symptoms of MS.  Due to her autoimmune disorder, literature review for other treatment options was also performed.  Imaging personally reviewed:  MRI of the brain, orbits and cervical spine 03/18/2022 showed normal optic nerves.  The brain was normal.  The spinal cord was normal.  No significant degenerative change.  MRI of the thoracic spine 03/20/2022 showed a normal spinal cord and no degenerative changes.  Laboratory: July 2023: NMO, anti-MOG, ESR, ACE, Lyme, FTA, HIV, hemoglobin A1c, antiphospholipid syndrome panel, ANA were all negative or noncontributory.   REVIEW OF SYSTEMS: Constitutional: No fevers, chills, sweats, or change in appetite.  Some fatigue Eyes: See above Ear, nose and throat: No hearing loss, ear pain, nasal congestion, sore throat Cardiovascular: No chest pain, palpitations Respiratory:  No shortness of breath at rest or with exertion.   No wheezes GastrointestinaI: No nausea, vomiting, diarrhea, abdominal pain, fecal incontinence Genitourinary:  No dysuria, urinary retention or frequency.  No nocturia. Musculoskeletal:  No neck pain, back pain Integumentary: She has hydradenitis suppurativa  Neurological: as above Psychiatric: No depression at this time.  No anxiety Endocrine: No palpitations, diaphoresis, change in appetite, change in weigh or increased thirst Hematologic/Lymphatic:  No anemia, purpura, petechiae. Allergic/Immunologic: No itchy/runny eyes, nasal congestion, recent allergic reactions, rashes  ALLERGIES: No Known Allergies  HOME MEDICATIONS:  Current Outpatient Medications:    ELURYNG 0.12-0.015 MG/24HR vaginal ring, Place vaginally., Disp: , Rfl:   PAST MEDICAL HISTORY: Past  Medical History:  Diagnosis Date   Hidradenitis    Iron deficiency anemia due to chronic blood loss 01/05/2022    PAST SURGICAL HISTORY: Past Surgical History:  Procedure Laterality Date   NO PAST SURGERIES      FAMILY  HISTORY: Family History  Problem Relation Age of Onset   Obesity Mother    Varicose Veins Mother    Hypertension Father    ADD / ADHD Son     SOCIAL HISTORY:  Social History   Socioeconomic History   Marital status: Married    Spouse name: Rinaldo   Number of children: 3   Years of education: Not on file   Highest education level: Bachelor's degree (e.g., BA, AB, BS)  Occupational History   Not on file  Tobacco Use   Smoking status: Light Smoker    Packs/day: 0.25    Years: 15.00    Total pack years: 3.75    Types: Cigarettes   Smokeless tobacco: Never  Vaping Use   Vaping Use: Never used  Substance and Sexual Activity   Alcohol use: Yes    Alcohol/week: 6.0 standard drinks of alcohol    Types: 3 Glasses of wine, 3 Shots of liquor per week    Comment: occasionally   Drug use: No   Sexual activity: Yes    Birth control/protection: Other-see comments    Comment: Nuvaring  Other Topics Concern   Not on file  Social History Narrative   Lives at home with husband and 3 kids   R handed   Caffeine: rare   Social Determinants of Health   Financial Resource Strain: Not on file  Food Insecurity: Not on file  Transportation Needs: Not on file  Physical Activity: Not on file  Stress: Not on file  Social Connections: Not on file  Intimate Partner Violence: Not on file     PHYSICAL EXAM  Vitals:   04/07/22 0858  BP: 120/74  Pulse: 93  Weight: 196 lb 12.8 oz (89.3 kg)  Height: _0  (1.702 m)    Body mass index is 30.82 kg/m.  Vision Screening   Right eye Left eye Both eyes  Without correction 20/200 20/40 20/30  With correction     Comments: Last actual eye exam was 2021.  Had exam 03/18/2022  General: The patient is well-developed and well-nourished and in no acute distress  HEENT:  Head is [place]/AT.  Sclera are anicteric.  Funduscopic exam shows normal optic discs and retinal vessels.  Neck: No carotid bruits are noted.  The neck is  nontender.  Cardiovascular: The heart has a regular rate and rhythm with a normal S1 and S2. There were no murmurs, gallops or rubs.    Skin: Extremities are without rash or  edema.  Musculoskeletal:  Back is nontender  Neurologic Exam  Mental status: The patient is alert and oriented x 3 at the time of the examination. The patient has apparent normal recent and remote memory, with an apparently normal attention span and concentration ability.   Speech is normal.  Cranial nerves: Extraocular movements are full. Pupils show 3+ APD.  Colotrs are desaturated.  Facial symmetry is present. There is good facial sensation to soft touch bilaterally.Facial strength is normal.  Trapezius and sternocleidomastoid strength is normal. No dysarthria is noted.  The tongue is midline, and the patient has symmetric elevation of the soft palate. No obvious hearing deficits are noted.  Motor:  Muscle bulk is normal.   Tone is normal.  Strength is  5 / 5 in all 4 extremities.   Sensory: Sensory testing is intact to pinprick, soft touch and vibration sensation in all 4 extremities.  Coordination: Cerebellar testing reveals good finger-nose-finger and heel-to-shin bilaterally.  Gait and station: Station is normal.   Gait is normal. Tandem gait is normal. Romberg is negative.   Reflexes: Deep tendon reflexes are symmetric and normal bilaterally.        DIAGNOSTIC DATA (LABS, IMAGING, TESTING) - I reviewed patient records, labs, notes, testing and imaging myself where available.  Lab Results  Component Value Date   WBC 10.9 (H) 03/19/2022   HGB 11.4 (L) 03/19/2022   HCT 33.9 (L) 03/19/2022   MCV 91.9 03/19/2022   PLT 281 03/19/2022      Component Value Date/Time   NA 139 03/19/2022 0256   K 4.1 03/19/2022 0256   CL 110 03/19/2022 0256   CO2 18 (L) 03/19/2022 0256   GLUCOSE 197 (H) 03/19/2022 0256   BUN 12 03/19/2022 0256   CREATININE 0.84 03/19/2022 0256   CALCIUM 9.1 03/19/2022 0256   PROT 8.2  01/03/2022 1124   ALBUMIN 4.3 01/03/2022 1124   AST 16 01/03/2022 1124   ALT 15 01/03/2022 1124   ALKPHOS 61 01/03/2022 1124   BILITOT 0.4 01/03/2022 1124   GFRNONAA >60 03/19/2022 0256   Lab Results  Component Value Date   CHOL 218 (H) 01/03/2022   HDL 55.50 01/03/2022   LDLCALC 141 (H) 01/03/2022   TRIG 110.0 01/03/2022   CHOLHDL 4 01/03/2022   Lab Results  Component Value Date   HGBA1C 5.6 03/19/2022   No results found for: "VITAMINB12" Lab Results  Component Value Date   TSH 0.77 01/03/2022       ASSESSMENT AND PLAN  Optic neuritis  Hidradenitis suppurativa  Vitamin D deficiency  Decreased vision of right eye  In summary, Ms. Fraley is a 36 year old woman who presented with right optic neuritis in early July 2023.  She has no other neurologic symptoms and the MRI of the brain and spinal cord did not show other demyelinating plaques.  I discussed with her and her husband that based on studies of optic neuritis, people who present with a normal brain MRI have about a 25% chance of eventually developing MS.  However, most times when optic neuritis occurs without other evidence of demyelination this will be a monophasic illness.  She has recovered mildly compared to 2 to 3 weeks ago.  However, vision is still only 20/200 and colors are very desaturated OD.  I expect her to have some more improvement but she may not recover to 20/20.    I am concerned that the optic neuritis occurred while she has been on anti-TNF treatment (Humira) for hidradenitis suppurativa.  Anti-TNF agents have been associated with demyelinating events and could make MS worse (if she does indeed have that).  Therefore, I recommend a switch in agents.  She has been very well controlled on Humira and was not controlled well with topicals.  There has been a large study of Cosentyx for hidradenitis suppurativa and I printed out the abstract so that she may presented to her dermatologist as soon as she gets an  appointment.  That would be my first choice.  There is less evidence for other biologic agents that I was able to find on my pubmed review.  She will return to see me in about 9 months for another examination.  Around that time we will also  check an MRI of the brain to determine if there has been any progression.  If this has occurred the likelihood of MS is much higher.  If the MRI of the brain shows no new lesions I will still want to check annual MRIs another 2 to 3 years.  After that, if MRIs of the brain are normal the likelihood of MS would be significantly lower.  She is advised to call me if she has any new neurologic symptoms before her next appointment with me and we discussed possible symptoms of MS.  Thank you for asking me to see Ms. Visser.  Please let me know if I can be of further assistance with her or other patients in the future.     Rhia Blatchford A. Felecia Shelling, MD, [facility] 1/62/4469, 50:72 AM Certified in Neurology, Clinical Neurophysiology, Sleep Medicine and Neuroimaging  [facility] [address], [place], [place] [address] [phone]

## 2022-04-07 NOTE — Patient Instructions (Signed)
Secukinumab in moderate-to-severe hidradenitis suppurativa (SUNSHINE and SUNRISE): week 16 and week 52 results of two identical, multicentre, randomised, placebo-controlled, double-blind phase 3 trials Prof Margarita Sermons, MD  Prof Hyman Bower, DMSc Prof Basilio Cairo, MD Marquette Saa, MD Prof Bosie Helper, MD Prof Luis Abed, MD et al. Published:February [phone], 2023DOI:[email or web address])[email or web address]   Summary Background Few therapeutic options are available for patients with moderate-to-severe hidradenitis suppurativa. We aimed to assess the efficacy of secukinumab in patients with moderate-to-severe hidradenitis suppurativa in two randomised trials. Methods SUNSHINE and SUNRISE were identical, multicentre, randomised, placebo-controlled, double-blind phase 3 trials done in 219 primary sites in 40 countries. Patients aged 38 years old or older with the capacity to provide written informed consent and with moderate-to-severe hidradenitis suppurativa (defined as a total of ?5 inflammatory lesions affecting ?2 distinct anatomical areas) for at least 1 year were eligible for inclusion. Included patients also agreed to daily use of topical over-the-counter antiseptics on the areas affected by hidradenitis suppurativa lesions while on study treatment. Patients were excluded if they had 20 or more fistulae at baseline, had ongoing active conditions requiring treatment with prohibited medication (eg, systemic biological immunomodulating treatment, live vaccines, or other investigational treatments), or met other exclusion criteria. In both trials, patients were randomly assigned (1:1:1) by means of interactive response technology to receive subcutaneous secukinumab 300 mg every 2 weeks, subcutaneous secukinumab 300 mg every 4 weeks, or subcutaneous placebo all via a 2 mL prefilled syringe in a double-dummy method as per treatment assignment. The primary endpoint was the proportion of  patients with a hidradenitis suppurativa clinical response, defined as a decrease in abscess and inflammatory nodule count by 50% or more with no increase in the number of abscesses or in the number of draining fistulae compared with baseline, at week 16, assessed in the overall population. Hidradenitis suppurativa clinical response was calculated based on the number of abscesses, inflammatory nodules, draining fistulae, total fistulae, and other lesions in the hidradenitis suppurativa affected areas. Safety was assessed by evaluating the presence of adverse events and serious adverse events according to common terminology criteria for adverse events, which were coded using Medical Dictionary for [employer] terminology. Both the [place], U835232, and [place], W3870388, trials are registered with [employer]. Findings Between Oct 12, 2017, and February 17, 2020, 676 patients were screened for inclusion in the SUNSHINE trial, of whom 541 (80%; 304 [56%] women and 237 [44%] men; mean age 38 years [SD 117]) were included in the analysis (181 [33%] in the secukinumab every 2 weeks group, 180 [33%] in the secukinumab every 4 weeks group, and 180 [33%] in the placebo group). Between the same recruitment dates, 687 patients were screened for inclusion in the SUNRISE trial, of whom 543 (79%; 306 [56%] women and 237 [44%] men; mean age 38 [114] years) were included in the analysis (180 [33%] in the secukinumab every 2 weeks group, 180 [33%] in the secukinumab every 4 weeks group, and 183 [34%] in the placebo group). In the SUNSHINE trial, significantly more patients in the secukinumab every 2 weeks group had a hidradenitis suppurativa clinical response (rounded average number of patients with response in 100 imputations, 815 [45%] of 181 patients) compared with the placebo group (607 [34%] of 180 patients; odds ratio 18 [95% CI 11-27]; p=00070). However, there was no significant difference  between the number of patients in the secukinumab every 4 weeks group (752 [42%] of 180 patients) and the placebo group (15 [10-23]; p=0042). Compared with  the placebo group (571 [31%] of 183 patients), significantly more patients in the secukinumab every 2 weeks group (762 [42%] of 180 patients; 16 [11-26]; p=0015) and the secukinumab every 4 weeks group (831 [46%] of 180 patients; 19 [12-30]; p=00022) had a hidradenitis suppurativa clinical response in the SUNRISE trial. Patient responses were sustained up to the end of the trials at week 52. The most common adverse event by preferred term up to week 16 was headache in both the SUNSHINE (17 [9%] patients in the secukinumab every 2 weeks group, 20 [11%] in the secukinumab every 4 weeks group, and 14 [8%] in the placebo group) and SUNRISE (21 [12%] patients in the secukinumab every 2 weeks group, 17 [9%] in the secukinumab every 4 weeks group, and 15 [8%] in the placebo group) trials. No study-related deaths were reported up to week 16. The safety profile of secukinumab in both trials was consistent with that previously reported, with no new or unexpected safety findings detected. Interpretation When given every 2 weeks, secukinumab was clinically effective at rapidly improving signs and symptoms of hidradenitis suppurativa with a favourable safety profile and with sustained response up to 52 weeks of treatment.

## 2022-04-08 DIAGNOSIS — H469 Unspecified optic neuritis: Secondary | ICD-10-CM | POA: Diagnosis not present

## 2022-04-11 ENCOUNTER — Encounter: Payer: Self-pay | Admitting: Nurse Practitioner

## 2022-04-11 NOTE — Assessment & Plan Note (Addendum)
Possibly related to humira? Last Humira dose 03/03/2022, onset of symptoms 6/31/2023 associated with generalized fatigue. Hospitalized 07/7 to 7/13 due to sudden vision loss in R.eye. She  Was treated with systemic corticosteroid (IV and oral) She reports improved vision but still not able to drive. F/up appt with appt with neurology 04/07/22 and with ophthalmology 04/08/22. She is waiting for appt with dermatology. MRI and CT were negative. Labs were unremarkable.  Today she is concerned about persistent hyperglycemia after completion of oral prednisone. CBG random: 71 today hgbA1c during hospital stay: 5.6%

## 2022-08-10 DIAGNOSIS — H469 Unspecified optic neuritis: Secondary | ICD-10-CM | POA: Diagnosis not present

## 2022-10-04 DIAGNOSIS — L732 Hidradenitis suppurativa: Secondary | ICD-10-CM | POA: Diagnosis not present

## 2022-12-02 DIAGNOSIS — H35412 Lattice degeneration of retina, left eye: Secondary | ICD-10-CM | POA: Diagnosis not present

## 2022-12-02 DIAGNOSIS — H5213 Myopia, bilateral: Secondary | ICD-10-CM | POA: Diagnosis not present

## 2022-12-02 DIAGNOSIS — H469 Unspecified optic neuritis: Secondary | ICD-10-CM | POA: Diagnosis not present

## 2022-12-09 ENCOUNTER — Telehealth: Payer: Self-pay | Admitting: Neurology

## 2022-12-09 NOTE — Telephone Encounter (Signed)
She had a visit with Dr. Lucianne Lei 12/02/2022.  At that visit, visual acuity was 20/30 OD (improved from 20/200 when I saw her last) she has an APD.  RNFL is thinned OD  She has a visit with me 01/09/2023.  Due to the optic neuritis, I would likely want to repeat an MRI of the brain to determine if there has been any progression.

## 2022-12-13 ENCOUNTER — Telehealth: Payer: Self-pay | Admitting: Neurology

## 2022-12-13 NOTE — Telephone Encounter (Signed)
LVM and sent mychart msg informing pt of appt change- MD out 4/29.

## 2023-01-05 ENCOUNTER — Ambulatory Visit (INDEPENDENT_AMBULATORY_CARE_PROVIDER_SITE_OTHER): Payer: Federal, State, Local not specified - PPO | Admitting: Nurse Practitioner

## 2023-01-05 ENCOUNTER — Encounter: Payer: Self-pay | Admitting: Nurse Practitioner

## 2023-01-05 VITALS — BP 112/80 | HR 80 | Temp 98.4°F | Resp 16 | Ht 67.0 in | Wt 199.0 lb

## 2023-01-05 DIAGNOSIS — D500 Iron deficiency anemia secondary to blood loss (chronic): Secondary | ICD-10-CM

## 2023-01-05 DIAGNOSIS — L732 Hidradenitis suppurativa: Secondary | ICD-10-CM

## 2023-01-05 DIAGNOSIS — E7800 Pure hypercholesterolemia, unspecified: Secondary | ICD-10-CM | POA: Diagnosis not present

## 2023-01-05 DIAGNOSIS — E559 Vitamin D deficiency, unspecified: Secondary | ICD-10-CM | POA: Diagnosis not present

## 2023-01-05 DIAGNOSIS — H469 Unspecified optic neuritis: Secondary | ICD-10-CM

## 2023-01-05 DIAGNOSIS — R7303 Prediabetes: Secondary | ICD-10-CM | POA: Insufficient documentation

## 2023-01-05 DIAGNOSIS — Z0001 Encounter for general adult medical examination with abnormal findings: Secondary | ICD-10-CM | POA: Diagnosis not present

## 2023-01-05 DIAGNOSIS — Z0000 Encounter for general adult medical examination without abnormal findings: Secondary | ICD-10-CM

## 2023-01-05 LAB — LIPID PANEL
Cholesterol: 213 mg/dL — ABNORMAL HIGH (ref 0–200)
HDL: 55.3 mg/dL (ref >39.00)
LDL Cholesterol: 135 mg/dL — ABNORMAL HIGH (ref 0–99)
NonHDL: 157.2
Total CHOL/HDL Ratio: 4
Triglycerides: 109 mg/dL (ref 0.0–149.0)
VLDL: 21.8 mg/dL (ref 0.0–40.0)

## 2023-01-05 LAB — COMPREHENSIVE METABOLIC PANEL
ALT: 15 U/L (ref 0–35)
AST: 16 U/L (ref 0–37)
Albumin: 4.6 g/dL (ref 3.5–5.2)
Alkaline Phosphatase: 74 U/L (ref 39–117)
BUN: 10 mg/dL (ref 6–23)
CO2: 26 mEq/L (ref 19–32)
Calcium: 9.5 mg/dL (ref 8.4–10.5)
Chloride: 103 mEq/L (ref 96–112)
Creatinine, Ser: 0.69 mg/dL (ref 0.40–1.20)
GFR: 109.94 mL/min (ref >60.00)
Glucose, Bld: 95 mg/dL (ref 70–99)
Potassium: 4.1 mEq/L (ref 3.5–5.1)
Sodium: 139 mEq/L (ref 135–145)
Total Bilirubin: 0.3 mg/dL (ref 0.2–1.2)
Total Protein: 8.9 g/dL — ABNORMAL HIGH (ref 6.0–8.3)

## 2023-01-05 LAB — IBC + FERRITIN
Ferritin: 55.1 ng/mL (ref 10.0–291.0)
Iron: 52 ug/dL (ref 42–145)
Saturation Ratios: 12 % — ABNORMAL LOW (ref 20.0–50.0)
TIBC: 432.6 ug/dL (ref 250.0–450.0)
Transferrin: 309 mg/dL (ref 212.0–360.0)

## 2023-01-05 LAB — CBC WITH DIFFERENTIAL/PLATELET
Basophils Absolute: 0 10*3/uL (ref 0.0–0.1)
Basophils Relative: 0.2 % (ref 0.0–3.0)
Eosinophils Absolute: 0.1 10*3/uL (ref 0.0–0.7)
Eosinophils Relative: 0.6 % (ref 0.0–5.0)
HCT: 36.2 % (ref 36.0–46.0)
Hemoglobin: 12.1 g/dL (ref 12.0–15.0)
Lymphocytes Relative: 32.2 % (ref 12.0–46.0)
Lymphs Abs: 3.3 10*3/uL (ref 0.7–4.0)
MCHC: 33.4 g/dL (ref 30.0–36.0)
MCV: 90.1 fl (ref 78.0–100.0)
Monocytes Absolute: 0.4 10*3/uL (ref 0.1–1.0)
Monocytes Relative: 4.4 % (ref 3.0–12.0)
Neutro Abs: 6.4 10*3/uL (ref 1.4–7.7)
Neutrophils Relative %: 62.6 % (ref 43.0–77.0)
Platelets: 290 10*3/uL (ref 150.0–400.0)
RBC: 4.01 Mil/uL (ref 3.87–5.11)
RDW: 14.5 % (ref 11.5–15.5)
WBC: 10.2 10*3/uL (ref 4.0–10.5)

## 2023-01-05 LAB — HEMOGLOBIN A1C: Hgb A1c MFr Bld: 6 % (ref 4.6–6.5)

## 2023-01-05 LAB — VITAMIN D 25 HYDROXY (VIT D DEFICIENCY, FRACTURES): VITD: 18.44 ng/mL — ABNORMAL LOW (ref 30.00–100.00)

## 2023-01-05 MED ORDER — VITAMIN D (ERGOCALCIFEROL) 1.25 MG (50000 UNIT) PO CAPS: 50000.0000 [IU] | ORAL_CAPSULE | ORAL | 0 refills | Status: DC

## 2023-01-05 NOTE — Patient Instructions (Signed)
Go to lab Continue Heart healthy diet and daily exercise. Maintain current medications. Start daily multivitamin 1tab daily with food  Preventive Care 39-39 Years Old, Female Preventive care refers to lifestyle choices and visits with your health care provider that can promote health and wellness. Preventive care visits are also called wellness exams. What can I expect for my preventive care visit? Counseling During your preventive care visit, your health care provider may ask about your: Medical history, including: Past medical problems. Family medical history. Pregnancy history. Current health, including: Menstrual cycle. Method of birth control. Emotional well-being. Home life and relationship well-being. Sexual activity and sexual health. Lifestyle, including: Alcohol, nicotine or tobacco, and drug use. Access to firearms. Diet, exercise, and sleep habits. Work and work Astronomer. Sunscreen use. Safety issues such as seatbelt and bike helmet use. Physical exam Your health care provider may check your: Height and weight. These may be used to calculate your BMI (body mass index). BMI is a measurement that tells if you are at a healthy weight. Waist circumference. This measures the distance around your waistline. This measurement also tells if you are at a healthy weight and may help predict your risk of certain diseases, such as type 2 diabetes and high blood pressure. Heart rate and blood pressure. Body temperature. Skin for abnormal spots. What immunizations do I need?  Vaccines are usually given at various ages, according to a schedule. Your health care provider will recommend vaccines for you based on your age, medical history, and lifestyle or other factors, such as travel or where you work. What tests do I need? Screening Your health care provider may recommend screening tests for certain conditions. This may include: Pelvic exam and Pap test. Lipid and cholesterol  levels. Diabetes screening. This is done by checking your blood sugar (glucose) after you have not eaten for a while (fasting). Hepatitis B test. Hepatitis C test. HIV (human immunodeficiency virus) test. STI (sexually transmitted infection) testing, if you are at risk. BRCA-related cancer screening. This may be done if you have a family history of breast, ovarian, tubal, or peritoneal cancers. Talk with your health care provider about your test results, treatment options, and if necessary, the need for more tests. Follow these instructions at home: Eating and drinking  Eat a healthy diet that includes fresh fruits and vegetables, whole grains, lean protein, and low-fat dairy products. Take vitamin and mineral supplements as recommended by your health care provider. Do not drink alcohol if: Your health care provider tells you not to drink. You are pregnant, may be pregnant, or are planning to become pregnant. If you drink alcohol: Limit how much you have to 0-1 drink a day. Know how much alcohol is in your drink. In the [place] one drink equals one 12 oz bottle of beer (355 mL), one 5 oz glass of wine (148 mL), or one 1 oz glass of hard liquor (44 mL). Lifestyle Brush your teeth every morning and night with fluoride toothpaste. Floss one time each day. Exercise for at least 30 minutes 5 or more days each week. Do not use any products that contain nicotine or tobacco. These products include cigarettes, chewing tobacco, and vaping devices, such as e-cigarettes. If you need help quitting, ask your health care provider. Do not use drugs. If you are sexually active, practice safe sex. Use a condom or other form of protection to prevent STIs. If you do not wish to become pregnant, use a form of birth control. If you plan  to become pregnant, see your health care provider for a prepregnancy visit. Find healthy ways to manage stress, such as: Meditation, yoga, or listening to  music. Journaling. Talking to a trusted person. Spending time with friends and family. Minimize exposure to UV radiation to reduce your risk of skin cancer. Safety Always wear your seat belt while driving or riding in a vehicle. Do not drive: If you have been drinking alcohol. Do not ride with someone who has been drinking. If you have been using any mind-altering substances or drugs. While texting. When you are tired or distracted. Wear a helmet and other protective equipment during sports activities. If you have firearms in your house, make sure you follow all gun safety procedures. Seek help if you have been physically or sexually abused. What's next? Go to your health care provider once a year for an annual wellness visit. Ask your health care provider how often you should have your eyes and teeth checked. Stay up to date on all vaccines. This information is not intended to replace advice given to you by your health care provider. Make sure you discuss any questions you have with your health care provider. Document Revised: 02/24/2021 Document Reviewed: 02/24/2021 Elsevier Patient Education  2023 [employer].

## 2023-01-05 NOTE — Addendum Note (Signed)
Addended by: Michaela Corner on: 01/05/2023 02:59 PM   Modules accepted: Orders

## 2023-01-05 NOTE — Assessment & Plan Note (Signed)
Check cbc and iron panel asymptomatic

## 2023-01-05 NOTE — Progress Notes (Signed)
Abnormal: Stable CMP and cbc Normal iron hgbA1c at 6.0%: prediabetes Low Vit D: start high dose x 12week, then switch to OTC dose 2000IU daily Abnormal lipid panel: Elevated total cholesterol and LDL Need to maintain heart healthy diet and daily exercise to improve cholesterol and glucose control. Schedule fasting lab appt in 3months.

## 2023-01-05 NOTE — Assessment & Plan Note (Signed)
Followed by [facility] dermatology. Current use of spironolactone and doxycycline

## 2023-01-05 NOTE — Progress Notes (Signed)
Complete physical exam  Patient: Annette Benitez   DOB: 04-18-1986   37 y.o. Female  MRN: [id number] Visit Date: 01/05/2023  Subjective:    Chief Complaint  Patient presents with   Annual Exam    Fasting - yes    Annette Benitez is a 37 y.o. female who presents today for a complete physical exam. She reports consuming a general diet.  No exercise regimen  She generally feels well. She reports sleeping well. She does not have additional problems to discuss today.  Vision:Yes Dental:No STD Screen:No  BP Readings from Last 3 Encounters:  01/05/23 112/80  04/07/22 120/74  04/06/22 116/80   Wt Readings from Last 3 Encounters:  01/05/23 199 lb (90.3 kg)  04/07/22 196 lb 12.8 oz (89.3 kg)  04/06/22 197 lb 9.6 oz (89.6 kg)   Most recent fall risk assessment:    01/05/2023    9:20 AM  Fall Risk   Falls in the past year? 0  Number falls in past yr: 0  Injury with Fall? 0  Risk for fall due to : No Fall Risks  Follow up Falls evaluation completed   Depression screen:Yes - No Depression  Most recent depression screenings:    01/05/2023    9:20 AM 01/03/2022   11:19 AM  PHQ 2/9 Scores  PHQ - 2 Score 0 0  PHQ- 9 Score  6   HPI  Optic neuritis Persistent right eye blurry vision, followed by opthalmology Humira and oral prednisone discontinued 6months ago. Use of corrective lens at this time  Vitamin D deficiency Repeat vit D Advised to start OTC supplement  Iron deficiency anemia due to chronic blood loss Check cbc and iron panel asymptomatic  Hidradenitis suppurativa Followed by [facility] dermatology. Current use of spironolactone and doxycycline  Past Medical History:  Diagnosis Date   Hidradenitis    Iron deficiency anemia due to chronic blood loss 01/05/2022   Past Surgical History:  Procedure Laterality Date   NO PAST SURGERIES     Social History   Socioeconomic History   Marital status: Married    Spouse name: Rinaldo   Number of children: 3   Years of  education: Not on file   Highest education level: Bachelor's degree (e.g., BA, AB, BS)  Occupational History   Not on file  Tobacco Use   Smoking status: Light Smoker    Packs/day: 0.25    Years: 15.00    Additional pack years: 0.00    Total pack years: 3.75    Types: Cigarettes   Smokeless tobacco: Never   Tobacco comments:    Smokes about 4cigarettes per week  Vaping Use   Vaping Use: Never used  Substance and Sexual Activity   Alcohol use: Yes    Alcohol/week: 6.0 standard drinks of alcohol    Types: 3 Glasses of wine, 3 Shots of liquor per week    Comment: occasionally   Drug use: No   Sexual activity: Yes    Birth control/protection: Other-see comments    Comment: Nuvaring  Other Topics Concern   Not on file  Social History Narrative   Lives at home with husband and 3 kids   R handed   Caffeine: rare   Social Determinants of Health   Financial Resource Strain: Low Risk  (01/05/2023)   Overall Financial Resource Strain (CARDIA)    Difficulty of Paying Living Expenses: Not hard at all  Food Insecurity: No Food Insecurity (01/05/2023)   Hunger Vital Sign  Worried About Programme researcher, broadcasting/film/video in the Last Year: Never true    Ran Out of Food in the Last Year: Never true  Transportation Needs: No Transportation Needs (01/05/2023)   PRAPARE - Administrator, Civil Service (Medical): No    Lack of Transportation (Non-Medical): No  Physical Activity: Inactive (01/05/2023)   Exercise Vital Sign    Days of Exercise per Week: 0 days    Minutes of Exercise per Session: 0 min  Stress: No Stress Concern Present (01/05/2023)   [employer] of Occupational Health - Occupational Stress Questionnaire    Feeling of Stress : Only a little  Social Connections: Moderately Isolated (01/05/2023)   Social Connection and Isolation Panel [NHANES]    Frequency of Communication with Friends and Family: More than three times a week    Frequency of Social Gatherings with Friends  and Family: Once a week    Attends Religious Services: Never    Database administrator or Organizations: No    Attends Banker Meetings: Never    Marital Status: Married  Catering manager Violence: Not At Risk (01/05/2023)   Humiliation, Afraid, Rape, and Kick questionnaire    Fear of Current or Ex-Partner: No    Emotionally Abused: No    Physically Abused: No    Sexually Abused: No   Family Status  Relation Name Status   Mother Annette Benitez Alive   Father Annette Benitez Alive   Son Annette [place]. (Not Specified)   Family History  Problem Relation Age of Onset   Obesity Mother    Varicose Veins Mother    Hypertension Father    ADD / ADHD Son    Allergies  Allergen Reactions   Humira (2 Pen) [Adalimumab] Other (See Comments)    Optic neuritis    Patient Care Team: Mella Inclan, Bonna Gains, NP as PCP - General (Internal Medicine) [place], Catarina Hartshorn, RN as Registered Nurse   Medications: Outpatient Medications Prior to Visit  Medication Sig   DOXYCYCLINE HYCLATE PO Take 100 mg by mouth 2 (two) times daily.   spironolactone (ALDACTONE) 25 MG tablet Take 25 mg by mouth 2 (two) times daily.   [DISCONTINUED] spironolactone (ALDACTONE) 25 MG tablet Take by mouth.   ELURYNG 0.12-0.015 MG/24HR vaginal ring Place vaginally.   No facility-administered medications prior to visit.    Review of Systems  Constitutional:  Negative for activity change, appetite change and unexpected weight change.  Respiratory: Negative.    Cardiovascular: Negative.   Gastrointestinal: Negative.   Endocrine: Negative for cold intolerance and heat intolerance.  Genitourinary: Negative.   Musculoskeletal: Negative.   Skin: Negative.   Neurological: Negative.   Hematological: Negative.   Psychiatric/Behavioral:  Negative for behavioral problems, decreased concentration, dysphoric mood, hallucinations, self-injury, sleep disturbance and suicidal ideas. The patient is not  nervous/anxious.         Objective:  BP 112/80 (BP Location: Left Arm, Patient Position: Sitting, Cuff Size: Large)   Pulse 80   Temp 98.4 F (36.9 C) (Temporal)   Resp 16   Ht 5\' 7"  (1.702 m)   Wt 199 lb (90.3 kg)   LMP 12/11/2022 (Approximate)   SpO2 99%   BMI 31.17 kg/m     Physical Exam Vitals and nursing note reviewed.  Constitutional:      General: She is not in acute distress. HENT:     Right Ear: Tympanic membrane, ear canal and external ear normal.     Left  Ear: Tympanic membrane, ear canal and external ear normal.     Nose: Nose normal.  Eyes:     Extraocular Movements: Extraocular movements intact.     Conjunctiva/sclera: Conjunctivae normal.     Pupils: Pupils are equal, round, and reactive to light.  Neck:     Thyroid: No thyroid mass, thyromegaly or thyroid tenderness.  Cardiovascular:     Rate and Rhythm: Normal rate and regular rhythm.     Pulses: Normal pulses.     Heart sounds: Normal heart sounds.  Pulmonary:     Effort: Pulmonary effort is normal.     Breath sounds: Normal breath sounds.  Abdominal:     General: Bowel sounds are normal.     Palpations: Abdomen is soft.  Genitourinary:    Comments: Breast and pelvic exam deferred to GYN Musculoskeletal:        General: Normal range of motion.     Cervical back: Normal range of motion and neck supple.     Right lower leg: No edema.     Left lower leg: No edema.  Lymphadenopathy:     Cervical: No cervical adenopathy.  Skin:    General: Skin is warm and dry.  Neurological:     Mental Status: She is alert and oriented to person, place, and time.     Cranial Nerves: No cranial nerve deficit.  Psychiatric:        Mood and Affect: Mood normal.        Behavior: Behavior normal.        Thought Content: Thought content normal.     No results found for any visits on 01/05/23.    Assessment & Plan:    Routine Health Maintenance and Physical Exam  Immunization History  Administered Date(s)  Administered   PFIZER Comirnaty(Gray Top)Covid-19 Tri-Sucrose Vaccine 04/17/2020, 04/30/2020   Tdap 03/24/2017   Zoster Recombinat (Shingrix) 11/27/2020   Health Maintenance  Topic Date Due   COVID-19 Vaccine (3 - Pfizer risk series) 05/28/2020   INFLUENZA VACCINE  04/13/2023   PAP SMEAR-Modifier  01/01/2024   DTaP/Tdap/Td (2 - Td or Tdap) 03/25/2027   Hepatitis C Screening  Completed   HIV Screening  Completed   HPV VACCINES  Aged Out   Discussed health benefits of physical activity, and encouraged her to engage in regular exercise appropriate for her age and condition.  Problem List Items Addressed This Visit       Nervous and Auditory   Optic neuritis    Persistent right eye blurry vision, followed by opthalmology Humira and oral prednisone discontinued 6months ago. Use of corrective lens at this time        Musculoskeletal and Integument   Hidradenitis suppurativa    Followed by [facility] dermatology. Current use of spironolactone and doxycycline        Other   Iron deficiency anemia due to chronic blood loss    Check cbc and iron panel asymptomatic      Relevant Orders   CBC with Differential/Platelet   IBC + Ferritin   Vitamin D deficiency    Repeat vit D Advised to start OTC supplement      Relevant Orders   VITAMIN D 25 Hydroxy (Vit-D Deficiency, Fractures)   Other Visit Diagnoses     Encounter for preventative adult health care exam with abnormal findings    -  Primary   Relevant Orders   Comprehensive metabolic panel   Hyperglycemia       Relevant Orders  Hemoglobin A1c   Encounter for lipid screening for cardiovascular disease       Relevant Orders   Lipid panel      Return in about 1 year (around 01/05/2024) for CPE (fasting).     Alysia Penna, NP

## 2023-01-05 NOTE — Assessment & Plan Note (Signed)
Repeat vit D Advised to start OTC supplement

## 2023-01-05 NOTE — Assessment & Plan Note (Addendum)
Persistent right eye blurry vision, followed by opthalmology Humira and oral prednisone discontinued 6months ago. Use of corrective lens at this time

## 2023-01-09 ENCOUNTER — Ambulatory Visit: Payer: Federal, State, Local not specified - PPO | Admitting: Neurology

## 2023-01-11 ENCOUNTER — Ambulatory Visit: Payer: Federal, State, Local not specified - PPO | Admitting: Neurology

## 2023-01-25 ENCOUNTER — Ambulatory Visit: Payer: Federal, State, Local not specified - PPO | Admitting: Neurology

## 2023-01-25 ENCOUNTER — Telehealth: Payer: Self-pay | Admitting: Neurology

## 2023-01-25 ENCOUNTER — Encounter: Payer: Self-pay | Admitting: Neurology

## 2023-01-25 VITALS — BP 118/76 | HR 92 | Ht 67.0 in | Wt 200.0 lb

## 2023-01-25 DIAGNOSIS — L732 Hidradenitis suppurativa: Secondary | ICD-10-CM | POA: Diagnosis not present

## 2023-01-25 DIAGNOSIS — E559 Vitamin D deficiency, unspecified: Secondary | ICD-10-CM

## 2023-01-25 DIAGNOSIS — H469 Unspecified optic neuritis: Secondary | ICD-10-CM | POA: Diagnosis not present

## 2023-01-25 NOTE — Progress Notes (Signed)
[facility]  PATIENT: Annette Benitez DOB: [birth year]  REFERRING DOCTOR OR PCP: Alysia Penna NP; Noralee Stain, DO SOURCE: Patient, notes from hospital admission including neurology and ophthalmology consults, laboratory and imaging reports, multiple MRI images personally reviewed.  _________________________________   HISTORICAL  CHIEF COMPLAINT:  Chief Complaint  Patient presents with   Follow-up    Pt in room 11. Here for Optic neuritis. Pt reports doing well, no new eye issues, headaches are better.     HISTORY OF PRESENT ILLNESS:   Annette Benitez is a 37 y.o. woman who had optic neuritis July 2023.  Update 01/25/2023: She continues to have reduced vision OD but it has improved.   Colors are still desaturated.     Gait, balance, strength, sensation, coordination and bladder function are all normal/baseline.  She had no preceding viral infection.  She had been on Humira for hydradenitis suppurativa at the time.    She stopped Humira after the ON.  She takes doxycycline if a flare.    We had discussed Cosentyx could be a safer treatment if the HS worsens.   Optic Neuritis History: She is a 37 year old woman who had the onset of right visual loss combined with right orbital pain upon eye movements.   She felt especially tired at the onset.    Symptom increased over 4 days.   Vision seemed shaded initially and then completely dark.   As it worsened, she was unable to see hand waving and even light.    She presented to the [facility] room 03/18/2022 (7 days after symptoms started).   She was seen by Dr. Zenaida Niece (ophthalmology).  Vision was counting fingers OD and 20/20 OS.   She had an APD.    Fundoscopic exam was normal.     She has hydradenitis suppurativa diagnosed at age 37.   She was on Humira since 2018/2019 for with benefit .   It worked better than topical treatment and antibiotics.  She has it on hold since the optic neuritis.     Besides the visual  problems she has no other neurologic issue at this time.   No FH of MS.   Her mom and aunt may also have mild HS and never needed a biologic treatment.     We spent much of the visit going over prognosis of optic neuritis with her MRI and laboratory presentation as well as discussing symptoms of MS.  Due to her autoimmune disorder, literature review for other treatment options was also performed.  Imaging reviewed:  MRI of the brain, orbits and cervical spine 03/18/2022 showed normal optic nerves.  The brain was normal.  The spinal cord was normal.  No significant degenerative change.  MRI of the thoracic spine 03/20/2022 showed a normal spinal cord and no degenerative changes.  Laboratory: July 2023: NMO, anti-MOG, ESR, ACE, Lyme, FTA, HIV, hemoglobin A1c, antiphospholipid syndrome panel, ANA were all negative or noncontributory.   REVIEW OF SYSTEMS: Constitutional: No fevers, chills, sweats, or change in appetite.  Some fatigue Eyes: See above Ear, nose and throat: No hearing loss, ear pain, nasal congestion, sore throat Cardiovascular: No chest pain, palpitations Respiratory:  No shortness of breath at rest or with exertion.   No wheezes GastrointestinaI: No nausea, vomiting, diarrhea, abdominal pain, fecal incontinence Genitourinary:  No dysuria, urinary retention or frequency.  No nocturia. Musculoskeletal:  No neck pain, back pain Integumentary: She has hydradenitis suppurativa  Neurological: as above Psychiatric: No depression at this  time.  No anxiety Endocrine: No palpitations, diaphoresis, change in appetite, change in weigh or increased thirst Hematologic/Lymphatic:  No anemia, purpura, petechiae. Allergic/Immunologic: No itchy/runny eyes, nasal congestion, recent allergic reactions, rashes  ALLERGIES: Allergies  Allergen Reactions   Humira (2 Pen) [Adalimumab] Other (See Comments)    Optic neuritis    HOME MEDICATIONS:  Current Outpatient Medications:    ELURYNG  0.12-0.015 MG/24HR vaginal ring, Place vaginally., Disp: , Rfl:    DOXYCYCLINE HYCLATE PO, Take 100 mg by mouth 2 (two) times daily. (Patient not taking: Reported on 01/25/2023), Disp: , Rfl:    spironolactone (ALDACTONE) 25 MG tablet, Take 25 mg by mouth 2 (two) times daily. (Patient not taking: Reported on 01/25/2023), Disp: , Rfl:    Vitamin D, Ergocalciferol, (DRISDOL) 1.25 MG (50000 UNIT) CAPS capsule, Take 1 capsule (50,000 Units total) by mouth every 7 (seven) days. (Patient not taking: Reported on 01/25/2023), Disp: 12 capsule, Rfl: 0  PAST MEDICAL HISTORY: Past Medical History:  Diagnosis Date   Hidradenitis    Iron deficiency anemia due to chronic blood loss 01/05/2022    PAST SURGICAL HISTORY: Past Surgical History:  Procedure Laterality Date   NO PAST SURGERIES      FAMILY HISTORY: Family History  Problem Relation Age of Onset   Obesity Mother    Varicose Veins Mother    Hypertension Father    ADD / ADHD Son     SOCIAL HISTORY:  Social History   Socioeconomic History   Marital status: Married    Spouse name: Rinaldo   Number of children: 3   Years of education: Not on file   Highest education level: Bachelor's degree (e.g., BA, AB, BS)  Occupational History   Not on file  Tobacco Use   Smoking status: Light Smoker    Packs/day: 0.25    Years: 15.00    Additional pack years: 0.00    Total pack years: 3.75    Types: Cigarettes   Smokeless tobacco: Never   Tobacco comments:    Smokes about 4cigarettes per week  Vaping Use   Vaping Use: Never used  Substance and Sexual Activity   Alcohol use: Yes    Alcohol/week: 6.0 standard drinks of alcohol    Types: 3 Glasses of wine, 3 Shots of liquor per week    Comment: occasionally   Drug use: No   Sexual activity: Yes    Birth control/protection: Other-see comments    Comment: Nuvaring  Other Topics Concern   Not on file  Social History Narrative   Lives at home with husband and 3 kids   R handed    Caffeine: rare   Social Determinants of Health   Financial Resource Strain: Low Risk  (01/05/2023)   Overall Financial Resource Strain (CARDIA)    Difficulty of Paying Living Expenses: Not hard at all  Food Insecurity: No Food Insecurity (01/05/2023)   Hunger Vital Sign    Worried About Running Out of Food in the Last Year: Never true    Ran Out of Food in the Last Year: Never true  Transportation Needs: No Transportation Needs (01/05/2023)   PRAPARE - Administrator, Civil Service (Medical): No    Lack of Transportation (Non-Medical): No  Physical Activity: Inactive (01/05/2023)   Exercise Vital Sign    Days of Exercise per Week: 0 days    Minutes of Exercise per Session: 0 min  Stress: No Stress Concern Present (01/05/2023)   [employer] of Occupational  Health - Occupational Stress Questionnaire    Feeling of Stress : Only a little  Social Connections: Moderately Isolated (01/05/2023)   Social Connection and Isolation Panel [NHANES]    Frequency of Communication with Friends and Family: More than three times a week    Frequency of Social Gatherings with Friends and Family: Once a week    Attends Religious Services: Never    Database administrator or Organizations: No    Attends Banker Meetings: Never    Marital Status: Married  Catering manager Violence: Not At Risk (01/05/2023)   Humiliation, Afraid, Rape, and Kick questionnaire    Fear of Current or Ex-Partner: No    Emotionally Abused: No    Physically Abused: No    Sexually Abused: No     PHYSICAL EXAM  Vitals:   01/25/23 0835  BP: 118/76  Pulse: 92  Weight: 200 lb (90.7 kg)  Height: 5\' 7"  (1.702 m)    Body mass index is 31.32 kg/m.  OD 20/50-1 OS 20/20-1  General: The patient is well-developed and well-nourished and in no acute distress  HEENT:  Head is [place]/AT.  Sclera are anicteric.  Skin: Extremities are without rash or  edema.  Musculoskeletal:  Back is  nontender  Neurologic Exam  Mental status: The patient is alert and oriented x 3 at the time of the examination. The patient has apparent normal recent and remote memory, with an apparently normal attention span and concentration ability.   Speech is normal.  Cranial nerves: Extraocular movements are full. Pupils show 2+ right  APD.  Colors are  desaturated OD .  Facial symmetry is present. There is good facial sensation to soft touch bilaterally.Facial strength is normal.  Trapezius and sternocleidomastoid strength is normal. No dysarthria is noted. No obvious hearing deficits are noted.  Motor:  Muscle bulk is normal.   Tone is normal. Strength is  5 / 5 in all 4 extremities.   Sensory: Sensory testing is intact to  soft touch and vibration sensation in all 4 extremities.  Coordination: Cerebellar testing reveals good finger-nose-finger and heel-to-shin bilaterally.  Gait and station: Station is normal.   Gait is normal. Tandem gait is normal. Romberg is negative.   Reflexes: Deep tendon reflexes are symmetric and normal bilaterally.        DIAGNOSTIC DATA (LABS, IMAGING, TESTING) - I reviewed patient records, labs, notes, testing and imaging myself where available.  Lab Results  Component Value Date   WBC 10.2 01/05/2023   HGB 12.1 01/05/2023   HCT 36.2 01/05/2023   MCV 90.1 01/05/2023   PLT 290.0 01/05/2023      Component Value Date/Time   NA 139 01/05/2023 0954   K 4.1 01/05/2023 0954   CL 103 01/05/2023 0954   CO2 26 01/05/2023 0954   GLUCOSE 95 01/05/2023 0954   BUN 10 01/05/2023 0954   CREATININE 0.69 01/05/2023 0954   CALCIUM 9.5 01/05/2023 0954   PROT 8.9 (H) 01/05/2023 0954   ALBUMIN 4.6 01/05/2023 0954   AST 16 01/05/2023 0954   ALT 15 01/05/2023 0954   ALKPHOS 74 01/05/2023 0954   BILITOT 0.3 01/05/2023 0954   GFRNONAA >60 03/19/2022 0256   Lab Results  Component Value Date   CHOL 213 (H) 01/05/2023   HDL 55.30 01/05/2023   LDLCALC 135 (H) 01/05/2023    TRIG 109.0 01/05/2023   CHOLHDL 4 01/05/2023   Lab Results  Component Value Date   HGBA1C 6.0 01/05/2023  No results found for: "VITAMINB12" Lab Results  Component Value Date   TSH 0.77 01/03/2022       ASSESSMENT AND PLAN  Optic neuritis - Plan: MR BRAIN W WO CONTRAST  Hidradenitis suppurativa  Vitamin D deficiency   She has isolated optic neuritis.  MRI of the brain and spine was normal last year.  We will check another MRI brain to determine if there has been any progression consistent with MS.  If this is occurring we would need to start a disease modifying therapy.  If the MRI of the brain does not show any new lesions, we will still need to check 1 more MRI in about a year Take vit D 5000 U daily We discussed that if she needs treatment for her HS that I would recommend that she go on Cosentyx as there is evidence for hidradenitis suppurativa and it also was trialed for MS and had mild efficacy and a phase 2 study but was not carried into a phase 3.  Therefore, it should be a safe medication for her to go on compared to the anti-TNF agents Rtc 1 year.  She needs to call if she has any new or worsening neurologic symptoms.  Around the time of the visit next year we will check another MRI of the brain     Lameshia Hypolite A. Epimenio Foot, MD, [facility] 01/25/2023, 9:43 AM Certified in Neurology, Clinical Neurophysiology, Sleep Medicine and Neuroimaging  [facility] [address] [place], [place] [address] [phone]

## 2023-01-25 NOTE — Telephone Encounter (Signed)
[employer] federal NPR sent to GI [phone] 

## 2023-04-01 ENCOUNTER — Other Ambulatory Visit: Payer: Self-pay | Admitting: Nurse Practitioner

## 2023-04-01 DIAGNOSIS — E559 Vitamin D deficiency, unspecified: Secondary | ICD-10-CM

## 2023-04-03 NOTE — Telephone Encounter (Signed)
Left patient a detailed voice message regarding annotation below and to return call to office to schedule lab visit to recheck vitamin D levels.

## 2023-04-07 ENCOUNTER — Other Ambulatory Visit: Payer: Self-pay | Admitting: Nurse Practitioner

## 2023-04-07 NOTE — Telephone Encounter (Signed)
Left vc message to contact our office and schedule an appointment

## 2023-04-10 NOTE — Telephone Encounter (Signed)
Sent a my chart message.

## 2023-06-07 DIAGNOSIS — H35412 Lattice degeneration of retina, left eye: Secondary | ICD-10-CM | POA: Diagnosis not present

## 2023-06-07 DIAGNOSIS — H469 Unspecified optic neuritis: Secondary | ICD-10-CM | POA: Diagnosis not present

## 2023-09-28 ENCOUNTER — Encounter: Payer: Self-pay | Admitting: Nurse Practitioner

## 2023-09-28 ENCOUNTER — Ambulatory Visit: Payer: Federal, State, Local not specified - PPO | Admitting: Nurse Practitioner

## 2023-09-28 VITALS — BP 133/86 | HR 80 | Temp 97.9°F | Resp 18 | Wt 201.0 lb

## 2023-09-28 DIAGNOSIS — E559 Vitamin D deficiency, unspecified: Secondary | ICD-10-CM | POA: Diagnosis not present

## 2023-09-28 DIAGNOSIS — M25551 Pain in right hip: Secondary | ICD-10-CM | POA: Diagnosis not present

## 2023-09-28 DIAGNOSIS — E7800 Pure hypercholesterolemia, unspecified: Secondary | ICD-10-CM

## 2023-09-28 DIAGNOSIS — R7303 Prediabetes: Secondary | ICD-10-CM | POA: Diagnosis not present

## 2023-09-28 LAB — LIPID PANEL
Cholesterol: 176 mg/dL (ref 0–200)
HDL: 58.9 mg/dL (ref >39.00)
LDL Cholesterol: 88 mg/dL (ref 0–99)
NonHDL: 116.73
Total CHOL/HDL Ratio: 3
Triglycerides: 146 mg/dL (ref 0.0–149.0)
VLDL: 29.2 mg/dL (ref 0.0–40.0)

## 2023-09-28 LAB — VITAMIN D 25 HYDROXY (VIT D DEFICIENCY, FRACTURES): VITD: 17.38 ng/mL — ABNORMAL LOW (ref 30.00–100.00)

## 2023-09-28 LAB — HEMOGLOBIN A1C: Hgb A1c MFr Bld: 6.1 % (ref 4.6–6.5)

## 2023-09-28 MED ORDER — METHOCARBAMOL 500 MG PO TABS: 500.0000 mg | ORAL_TABLET | Freq: Two times a day (BID) | ORAL | 0 refills | Status: DC | PRN

## 2023-09-28 MED ORDER — MELOXICAM 15 MG PO TABS: 15.0000 mg | ORAL_TABLET | Freq: Every day | ORAL | 0 refills | Status: DC

## 2023-09-28 NOTE — Progress Notes (Signed)
Established Patient Visit  Patient: Annette Benitez   DOB: 11-15-1985   38 y.o. Female  MRN: [id number] Visit Date: 09/28/2023  Subjective:    Chief Complaint  Patient presents with   Acute Visit    PT C/O of right hip and lower back pain for 1 month became worse over time; aleve and heating pad used for symptoms      Back Pain This is a new problem. The current episode started 1 to 4 weeks ago. The problem occurs constantly. The problem is unchanged. The pain is present in the lumbar spine. The quality of the pain is described as aching and cramping. The pain radiates to the right thigh. The pain is Worse during the night. The symptoms are aggravated by sitting, standing and lying down. Stiffness is present In the morning. Pertinent negatives include no abdominal pain, bladder incontinence, bowel incontinence, chest pain, dysuria, fever, headaches, leg pain, numbness, paresis, paresthesias, pelvic pain, perianal numbness, tingling, weakness or weight loss. Risk factors include obesity, poor posture, sedentary lifestyle and lack of exercise. She has tried NSAIDs for the symptoms. The treatment provided moderate relief.   No problem-specific Assessment & Plan notes found for this encounter.  BP Readings from Last 3 Encounters:  09/28/23 133/86  01/25/23 118/76  01/05/23 112/80    Reviewed medical, surgical, and social history today  Medications: Outpatient Medications Prior to Visit  Medication Sig   ELURYNG 0.12-0.015 MG/24HR vaginal ring Place vaginally.   [DISCONTINUED] DOXYCYCLINE HYCLATE PO Take 100 mg by mouth 2 (two) times daily. (Patient not taking: Reported on 09/28/2023)   [DISCONTINUED] spironolactone (ALDACTONE) 25 MG tablet Take 25 mg by mouth 2 (two) times daily. (Patient not taking: Reported on 09/28/2023)   [DISCONTINUED] Vitamin D, Ergocalciferol, (DRISDOL) 1.25 MG (50000 UNIT) CAPS capsule Take 1 capsule (50,000 Units total) by mouth every 7 (seven) days.  (Patient not taking: Reported on 09/28/2023)   No facility-administered medications prior to visit.   Reviewed past medical and social history.   ROS per HPI above      Objective:  BP 133/86 (BP Location: Left Arm, Patient Position: Sitting, Cuff Size: Large) Comment: recheck  Pulse 80   Temp 97.9 F (36.6 C) (Temporal)   Resp 18   Wt 201 lb (91.2 kg)   LMP  (Exact Date)   SpO2 98%   BMI 31.48 kg/m      Physical Exam Vitals and nursing note reviewed.  Cardiovascular:     Rate and Rhythm: Normal rate.     Pulses: Normal pulses.  Pulmonary:     Effort: Pulmonary effort is normal.  Musculoskeletal:     Thoracic back: Normal.     Lumbar back: Normal.     Right hip: Tenderness present. No bony tenderness or crepitus. Normal range of motion. Normal strength.     Left hip: Normal.     Left lower leg: No edema.  Skin:    Findings: No rash.  Neurological:     Mental Status: She is alert.     Results for orders placed or performed in visit on 09/28/23  Hemoglobin A1c  Result Value Ref Range   Hgb A1c MFr Bld 6.1 4.6 - 6.5 %      Assessment & Plan:    Problem List Items Addressed This Visit     Prediabetes   Pure hypercholesterolemia   Vitamin D deficiency   Other Visit  Diagnoses       Acute right hip pain    -  Primary   Relevant Medications   meloxicam (MOBIC) 15 MG tablet   methocarbamol (ROBAXIN) 500 MG tablet      Return in about 3 months (around 01/05/2024) for CPE (fasting).     Alysia Penna, NP

## 2023-09-28 NOTE — Patient Instructions (Signed)
Avoid OVER THE COUNTER NSAIDs (ibuprofen, aleve, motrin, advil, naproxen) while taking meloxicam. Do not take robaxin and drive due to risk of sedation. Start lower back and hip exercise. Go to lab

## 2023-09-29 ENCOUNTER — Encounter: Payer: Self-pay | Admitting: Nurse Practitioner

## 2023-10-09 ENCOUNTER — Other Ambulatory Visit: Payer: Self-pay | Admitting: Nurse Practitioner

## 2023-10-09 DIAGNOSIS — M25551 Pain in right hip: Secondary | ICD-10-CM

## 2023-10-28 ENCOUNTER — Other Ambulatory Visit: Payer: Self-pay | Admitting: Nurse Practitioner

## 2023-10-28 DIAGNOSIS — M25551 Pain in right hip: Secondary | ICD-10-CM

## 2023-10-30 NOTE — Telephone Encounter (Signed)
 Requesting: MELOXICAM 15MG  TABLETS  Last Visit: 09/28/2023 Next Visit: 01/08/2024 Last Refill: 10/09/2023  Please Advise

## 2023-11-06 ENCOUNTER — Encounter: Payer: Self-pay | Admitting: Nurse Practitioner

## 2023-11-06 DIAGNOSIS — M25551 Pain in right hip: Secondary | ICD-10-CM

## 2023-11-06 DIAGNOSIS — M8700 Idiopathic aseptic necrosis of unspecified bone: Secondary | ICD-10-CM

## 2023-11-07 ENCOUNTER — Ambulatory Visit
Admission: RE | Admit: 2023-11-07 | Discharge: 2023-11-07 | Disposition: A | Discharge status: Home / Self Care | Payer: Federal, State, Local not specified - PPO | Source: Ambulatory Visit | Attending: Nurse Practitioner

## 2023-11-07 DIAGNOSIS — M25551 Pain in right hip: Secondary | ICD-10-CM | POA: Diagnosis not present

## 2023-11-07 DIAGNOSIS — M545 Low back pain, unspecified: Secondary | ICD-10-CM

## 2023-11-17 ENCOUNTER — Other Ambulatory Visit: Payer: Self-pay | Admitting: Nurse Practitioner

## 2023-11-17 DIAGNOSIS — M25551 Pain in right hip: Secondary | ICD-10-CM

## 2023-11-17 NOTE — Telephone Encounter (Signed)
 Please advise, see below.   LOV:09/28/2023 NOV:01/08/2024 Last rx refill: 10/30/2023

## 2023-11-20 ENCOUNTER — Encounter: Payer: Self-pay | Admitting: Nurse Practitioner

## 2023-11-20 DIAGNOSIS — M25551 Pain in right hip: Secondary | ICD-10-CM

## 2023-11-20 MED ORDER — TRAMADOL HCL 50 MG PO TABS: 50.0000 mg | ORAL_TABLET | Freq: Two times a day (BID) | ORAL | 0 refills | Status: AC | PRN

## 2023-11-20 MED ORDER — PREDNISONE 20 MG PO TABS: ORAL_TABLET | ORAL | 0 refills | Status: AC

## 2023-11-20 NOTE — Addendum Note (Signed)
 Addended by: Alysia Penna L on: 11/20/2023 01:22 PM   Modules accepted: Orders

## 2023-12-14 DIAGNOSIS — H04123 Dry eye syndrome of bilateral lacrimal glands: Secondary | ICD-10-CM | POA: Diagnosis not present

## 2023-12-14 DIAGNOSIS — H5213 Myopia, bilateral: Secondary | ICD-10-CM | POA: Diagnosis not present

## 2023-12-14 DIAGNOSIS — H469 Unspecified optic neuritis: Secondary | ICD-10-CM | POA: Diagnosis not present

## 2023-12-14 DIAGNOSIS — H35412 Lattice degeneration of retina, left eye: Secondary | ICD-10-CM | POA: Diagnosis not present

## 2024-01-08 ENCOUNTER — Encounter: Payer: Federal, State, Local not specified - PPO | Admitting: Nurse Practitioner

## 2024-01-25 ENCOUNTER — Ambulatory Visit: Payer: Federal, State, Local not specified - PPO | Admitting: Neurology

## 2024-01-25 ENCOUNTER — Encounter: Payer: Self-pay | Admitting: Neurology

## 2024-01-25 VITALS — BP 130/85 | HR 100 | Ht 67.0 in | Wt 196.2 lb

## 2024-01-25 DIAGNOSIS — H469 Unspecified optic neuritis: Secondary | ICD-10-CM | POA: Diagnosis not present

## 2024-01-25 DIAGNOSIS — L732 Hidradenitis suppurativa: Secondary | ICD-10-CM

## 2024-01-25 DIAGNOSIS — E559 Vitamin D deficiency, unspecified: Secondary | ICD-10-CM

## 2024-01-25 DIAGNOSIS — H5461 Unqualified visual loss, right eye, normal vision left eye: Secondary | ICD-10-CM | POA: Diagnosis not present

## 2024-01-25 NOTE — Progress Notes (Signed)
 [facility]  PATIENT: Annette Benitez DOB: [birth year]  REFERRING DOCTOR OR PCP: Kathrene Parents NP; Daren Eck, DO SOURCE: Patient, notes from hospital admission including neurology and ophthalmology consults, laboratory and imaging reports, multiple MRI images personally reviewed.  _________________________________   HISTORICAL  CHIEF COMPLAINT:  Chief Complaint  Patient presents with   Follow-up    RM 10, alone. Last seen 01/25/23.  Feels eyes fatigued intermittently from looking at computer a lot. No headaches. Light sensitive.     HISTORY OF PRESENT ILLNESS:   Annette Benitez is a 38 y.o. woman who had optic neuritis July 2023.  Update 01/25/2024: She continues to have reduced vision OD, though improved compared to 03/2022.   Colors are still desaturated OD.    However, no worsening.  Gait, balance, strength, sensation, coordination and bladder function are all normal/baseline.  She had no preceding viral infection.  Covid booster vaccination was one year prior.  She had been on Humira for hydradenitis suppurativa at the time.    She stopped Humira after the ON.  She takes doxycycline if a flare.    We had discussed Cosentyx could be a safer treatment if the HS worsens.   Optic Neuritis History: She had the onset of right visual loss combined with right orbital pain upon eye movements July 2023.   She felt especially tired at the onset.    Symptom increased over 4 days.   Vision seemed shaded initially and then completely dark.   As it worsened, she was unable to see hand waving and even light.    She presented to the [facility] emergency room 03/18/2022 (7 days after symptoms started).   She was seen by Dr. Carloyn Chi (ophthalmology).  Vision was counting fingers OD and 20/20 OS.   She had an APD.    Fundoscopic exam was normal.     Anti-MOG and -NMO Abs were negative.      She received 5 days of IV Solumedrol and she began to improve.  She has hydradenitis  suppurativa diagnosed at age 41.   She was on Humira since 2018/2019 for with benefit .   It worked better than topical treatment and antibiotics.  She was on Humira at the time of the ON but has bene off since that time.       Besides the visual problems she has no other neurologic issue at this time.     No FH of MS.   Her mom and aunt may also have mild HS and never needed a biologic treatment.      Imaging reviewed:  MRI of the brain, orbits and cervical spine 03/18/2022 showed normal optic nerves.  The brain was normal.  The spinal cord was normal.  No significant degenerative change.  MRI of the thoracic spine 03/20/2022 showed a normal spinal cord and no degenerative changes.  Laboratory: July 2023: NMO, anti-MOG, ESR, ACE, Lyme, FTA, HIV, hemoglobin A1c, antiphospholipid syndrome panel, ANA were all negative or noncontributory.   REVIEW OF SYSTEMS: Constitutional: No fevers, chills, sweats, or change in appetite.  Some fatigue Eyes: See above Ear, nose and throat: No hearing loss, ear pain, nasal congestion, sore throat Cardiovascular: No chest pain, palpitations Respiratory:  No shortness of breath at rest or with exertion.   No wheezes GastrointestinaI: No nausea, vomiting, diarrhea, abdominal pain, fecal incontinence Genitourinary:  No dysuria, urinary retention or frequency.  No nocturia. Musculoskeletal:  No neck pain, back pain Integumentary: She has hydradenitis suppurativa  Neurological: as above Psychiatric: No depression at this time.  No anxiety Endocrine: No palpitations, diaphoresis, change in appetite, change in weigh or increased thirst Hematologic/Lymphatic:  No anemia, purpura, petechiae. Allergic/Immunologic: No itchy/runny eyes, nasal congestion, recent allergic reactions, rashes  ALLERGIES: Allergies  Allergen Reactions   Humira (2 Pen) [Adalimumab] Other (See Comments)    Optic neuritis    HOME MEDICATIONS:  Current Outpatient Medications:    ELURYNG  0.12-0.015 MG/24HR vaginal ring, Place vaginally., Disp: , Rfl:   PAST MEDICAL HISTORY: Past Medical History:  Diagnosis Date   Hidradenitis    Iron deficiency anemia due to chronic blood loss 01/05/2022    PAST SURGICAL HISTORY: Past Surgical History:  Procedure Laterality Date   NO PAST SURGERIES      FAMILY HISTORY: Family History  Problem Relation Age of Onset   Obesity Mother    Varicose Veins Mother    Hypertension Father    ADD / ADHD Son     SOCIAL HISTORY:  Social History   Socioeconomic History   Marital status: Married    Spouse name: Rinaldo   Number of children: 3   Years of education: Not on file   Highest education level: Bachelor's degree (e.g., BA, AB, BS)  Occupational History   Not on file  Tobacco Use   Smoking status: Light Smoker    Current packs/day: 0.25    Average packs/day: 0.3 packs/day for 15.0 years (3.8 ttl pk-yrs)    Types: Cigarettes   Smokeless tobacco: Never   Tobacco comments:    Smokes about 4cigarettes per week  Vaping Use   Vaping status: Never Used  Substance and Sexual Activity   Alcohol use: Yes    Alcohol/week: 6.0 standard drinks of alcohol    Types: 3 Glasses of wine, 3 Shots of liquor per week    Comment: occasionally   Drug use: No   Sexual activity: Yes    Birth control/protection: Other-see comments    Comment: Nuvaring  Other Topics Concern   Not on file  Social History Narrative   Lives at home with husband and 3 kids   R handed   Caffeine: rare   Social Drivers of Corporate investment banker Strain: Low Risk  (01/05/2023)   Overall Financial Resource Strain (CARDIA)    Difficulty of Paying Living Expenses: Not hard at all  Food Insecurity: No Food Insecurity (01/05/2023)   Hunger Vital Sign    Worried About Running Out of Food in the Last Year: Never true    Ran Out of Food in the Last Year: Never true  Transportation Needs: No Transportation Needs (01/05/2023)   PRAPARE - Scientist, research (physical sciences) (Medical): No    Lack of Transportation (Non-Medical): No  Physical Activity: Inactive (01/05/2023)   Exercise Vital Sign    Days of Exercise per Week: 0 days    Minutes of Exercise per Session: 0 min  Stress: No Stress Concern Present (01/05/2023)   [employer] of Occupational Health - Occupational Stress Questionnaire    Feeling of Stress : Only a little  Social Connections: Moderately Isolated (01/05/2023)   Social Connection and Isolation Panel [NHANES]    Frequency of Communication with Friends and Family: More than three times a week    Frequency of Social Gatherings with Friends and Family: Once a week    Attends Religious Services: Never    Database administrator or Organizations: No    Attends Club or  Organization Meetings: Never    Marital Status: Married  Catering manager Violence: Not At Risk (01/05/2023)   Humiliation, Afraid, Rape, and Kick questionnaire    Fear of Current or Ex-Partner: No    Emotionally Abused: No    Physically Abused: No    Sexually Abused: No     PHYSICAL EXAM  Vitals:   01/25/24 0940  BP: 130/85  Pulse: 100  SpO2: 99%  Weight: 196 lb 3.2 oz (89 kg)  Height: 5\' 7"  (1.702 m)    Body mass index is 30.73 kg/m.  OD 20/50-1 OS 20/20  General: The patient is well-developed and well-nourished and in no acute distress  HEENT:  Head is [place]/AT.  Sclera are anicteric..  Mild OD optic nerve pallor on funduscopy.    Skin: Extremities are without rash or  edema.  Musculoskeletal:  Back is nontender  Neurologic Exam  Mental status: The patient is alert and oriented x 3 at the time of the examination. The patient has apparent normal recent and remote memory, with an apparently normal attention span and concentration ability.   Speech is normal.  Cranial nerves: Extraocular movements are full.   She has a  2+ right  APD.  Colors are  desaturated OD .  Facial strength and sensation was normal.   No dysarthria is noted. No obvious  hearing deficits are noted.  Motor:  Muscle bulk is normal.   Tone is normal. Strength is  5 / 5 in all 4 extremities.   Sensory: Sensory testing is intact to touch and vibration x 4.  Coordination: Cerebellar testing reveals good finger-nose-finger and heel-to-shin bilaterally.  Gait and station: Station is normal.   Gait is normal. Tandem gait is normal. Romberg is negative.   Reflexes: Deep tendon reflexes are symmetric and normal bilaterally.        DIAGNOSTIC DATA (LABS, IMAGING, TESTING) - I reviewed patient records, labs, notes, testing and imaging myself where available.  Lab Results  Component Value Date   WBC 10.2 01/05/2023   HGB 12.1 01/05/2023   HCT 36.2 01/05/2023   MCV 90.1 01/05/2023   PLT 290.0 01/05/2023      Component Value Date/Time   NA 139 01/05/2023 0954   K 4.1 01/05/2023 0954   CL 103 01/05/2023 0954   CO2 26 01/05/2023 0954   GLUCOSE 95 01/05/2023 0954   BUN 10 01/05/2023 0954   CREATININE 0.69 01/05/2023 0954   CALCIUM 9.5 01/05/2023 0954   PROT 8.9 (H) 01/05/2023 0954   ALBUMIN 4.6 01/05/2023 0954   AST 16 01/05/2023 0954   ALT 15 01/05/2023 0954   ALKPHOS 74 01/05/2023 0954   BILITOT 0.3 01/05/2023 0954   GFRNONAA >60 03/19/2022 0256   Lab Results  Component Value Date   CHOL 176 09/28/2023   HDL 58.90 09/28/2023   LDLCALC 88 09/28/2023   TRIG 146.0 09/28/2023   CHOLHDL 3 09/28/2023   Lab Results  Component Value Date   HGBA1C 6.1 09/28/2023   No results found for: "VITAMINB12" Lab Results  Component Value Date   TSH 0.77 01/03/2022       ASSESSMENT AND PLAN  Optic neuritis - Plan: Anti-Aquaporin (AQP4), Serum, MR BRAIN W WO CONTRAST  Hidradenitis suppurativa  Vitamin D  deficiency  Decreased vision of right eye   She has isolated optic neuritis.  We discussed getting another MRI of the brain.  If new lesions compatible with MS have developed, then I would be concerned about MS and we  could either begin a disease  modifying therapy or consider CSF analysis if only 1 or 2 foci.  I will also recheck the anti-aquaporin4 (NMO) antibody as a better test is now available to make sure she does not have NMOSD Take vit D 5000 U daily We discussed that if she needs treatment for her HS that I would recommend that she go on Cosentyx as there is evidence for hidradenitis suppurativa and it also was trialed for MS and had mild efficacy and a phase 2 study but was not carried into a phase 3.  Therefore, it should be a safe medication for her to go on compared to the anti-TNF agents Rtc depending the results or as needed if new or worsening neurologic symptoms.      Niyonna Betsill A. Godwin Lat, MD, [facility] 01/25/2024, 10:43 AM Certified in Neurology, Clinical Neurophysiology, Sleep Medicine and Neuroimaging  [facility] [address], [address] [place], [place] [address] [phone]

## 2024-01-27 ENCOUNTER — Ambulatory Visit: Payer: Self-pay | Admitting: Neurology

## 2024-01-27 LAB — ANTI-AQUAPORIN (AQP4), SERUM: AQP4 Antibody, Cell-based IFA: NEGATIVE

## 2024-01-31 ENCOUNTER — Ambulatory Visit

## 2024-01-31 DIAGNOSIS — H469 Unspecified optic neuritis: Secondary | ICD-10-CM | POA: Diagnosis not present

## 2024-01-31 MED ORDER — GADOBENATE DIMEGLUMINE 529 MG/ML IV SOLN
20.0000 mL | Freq: Once | INTRAVENOUS | Status: AC | PRN
  Administered 2024-01-31: 20 mL via INTRAVENOUS

## 2024-06-14 DIAGNOSIS — H469 Unspecified optic neuritis: Secondary | ICD-10-CM | POA: Diagnosis not present

## 2024-11-29 ENCOUNTER — Encounter: Admitting: Nurse Practitioner
